# Patient Record
Sex: Female | Born: 1960 | Race: White | Hispanic: No | Marital: Married | Smoking: Current every day smoker
Health system: Southern US, Community
[De-identification: ages and names within clinical notes are randomized; demographics above are authoritative.]

## PROBLEM LIST (undated history)

## (undated) DIAGNOSIS — E7800 Pure hypercholesterolemia, unspecified: Secondary | ICD-10-CM

## (undated) HISTORY — PX: OOPHORECTOMY: SHX86

## (undated) HISTORY — DX: Pure hypercholesterolemia, unspecified: E78.00

---

## 1985-02-05 HISTORY — PX: TUBAL LIGATION: SHX77

## 1996-02-06 HISTORY — PX: BILATERAL OOPHORECTOMY: SHX1221

## 1996-02-06 HISTORY — PX: VAGINAL HYSTERECTOMY: SUR661

## 1997-12-03 ENCOUNTER — Other Ambulatory Visit: Admission: RE | Admit: 1997-12-03 | Discharge: 1997-12-03 | Payer: Self-pay | Admitting: Obstetrics and Gynecology

## 1999-01-24 ENCOUNTER — Other Ambulatory Visit: Admission: RE | Admit: 1999-01-24 | Discharge: 1999-01-24 | Payer: Self-pay | Admitting: Gynecology

## 1999-09-17 ENCOUNTER — Emergency Department (HOSPITAL_COMMUNITY): Admission: EM | Admit: 1999-09-17 | Discharge: 1999-09-17 | Payer: Self-pay | Admitting: *Deleted

## 2007-02-06 HISTORY — PX: AUGMENTATION MAMMAPLASTY: SUR837

## 2007-11-07 ENCOUNTER — Ambulatory Visit: Payer: Self-pay | Admitting: Gynecology

## 2008-01-07 ENCOUNTER — Ambulatory Visit: Payer: Self-pay | Admitting: Gynecology

## 2008-03-19 ENCOUNTER — Ambulatory Visit: Payer: Self-pay | Admitting: Gynecology

## 2009-04-11 ENCOUNTER — Ambulatory Visit: Payer: Self-pay | Admitting: Gynecology

## 2009-04-11 ENCOUNTER — Other Ambulatory Visit: Admission: RE | Admit: 2009-04-11 | Discharge: 2009-04-11 | Payer: Self-pay | Admitting: Gynecology

## 2009-12-07 ENCOUNTER — Ambulatory Visit: Payer: Self-pay | Admitting: Gynecology

## 2010-07-12 ENCOUNTER — Encounter: Payer: Self-pay | Admitting: Gynecology

## 2011-03-29 ENCOUNTER — Ambulatory Visit (INDEPENDENT_AMBULATORY_CARE_PROVIDER_SITE_OTHER): Payer: Self-pay | Admitting: Gynecology

## 2011-03-29 ENCOUNTER — Encounter: Payer: Self-pay | Admitting: Gynecology

## 2011-03-29 VITALS — BP 110/66 | Ht 62.5 in | Wt 105.0 lb

## 2011-03-29 DIAGNOSIS — R5381 Other malaise: Secondary | ICD-10-CM

## 2011-03-29 DIAGNOSIS — M810 Age-related osteoporosis without current pathological fracture: Secondary | ICD-10-CM

## 2011-03-29 DIAGNOSIS — R5383 Other fatigue: Secondary | ICD-10-CM

## 2011-03-29 DIAGNOSIS — N83209 Unspecified ovarian cyst, unspecified side: Secondary | ICD-10-CM | POA: Insufficient documentation

## 2011-03-29 DIAGNOSIS — Z79890 Hormone replacement therapy (postmenopausal): Secondary | ICD-10-CM

## 2011-03-29 DIAGNOSIS — Z01419 Encounter for gynecological examination (general) (routine) without abnormal findings: Secondary | ICD-10-CM

## 2011-03-29 DIAGNOSIS — D219 Benign neoplasm of connective and other soft tissue, unspecified: Secondary | ICD-10-CM | POA: Insufficient documentation

## 2011-03-29 DIAGNOSIS — N951 Menopausal and female climacteric states: Secondary | ICD-10-CM

## 2011-03-29 MED ORDER — ESTRADIOL 1 MG PO TABS: 1.0000 mg | ORAL_TABLET | Freq: Every day | ORAL | Status: DC

## 2011-03-29 NOTE — Progress Notes (Signed)
Jeannetta Cer[facility] [birth year] [id number]        51 y.o.  for annual exam.  Several issues noted below.  Past medical history,surgical history, medications, allergies, family history and social history were all reviewed and documented in the EPIC chart. ROS:  Was performed and pertinent positives and negatives are included in the history.  Exam: Kim chaperone present Filed Vitals:   03/29/11 1118  BP: 110/66   General appearance  Normal Skin grossly normal Head/Neck normal with no cervical or supraclavicular adenopathy thyroid normal Lungs  clear Cardiac RR, without RMG Abdominal  soft, nontender, without masses, organomegaly or hernia Breasts  examined lying and sitting without masses, retractions, discharge or axillary adenopathy.  Bilateral implants noted. Pelvic  Ext/BUS/vagina  normal   Adnexa  Without masses or tenderness    Anus and perineum  normal   Rectovaginal  normal sphincter tone without palpated masses or tenderness.    Assessment/Plan:  51 y.o. female for annual exam.   Status post TVH BSO for dysmenorrhea and irregular bleeding. 1. ERT. Patient had been on ERT but ran out. She is having fatigue with some hot flashes. She wants to reinitiate ERT. I discussed the WHI study with her increased risk of stroke heart attack DVT possible increased risk of breast cancer and question about lung cancer. The advantage of transdermal versus oral to decrease thrombosis risk. The patient does not want transdermal but would prefer oral and accepts the risks. Prescribed estradiol 1 mg, she had been on lower doses before and said it didn't help as much. Refill x1 year we'll see how she does with this. I did order a TSH given her complaints of fatigue just to make sure that this is also normal. 2. Stop smoking. I encouraged her to stop smoking as she is actively trying down using the nicotine patch and I applauded her for this. 3. Mammogram. His been several years since she's had a  mammogram. She has bilateral implants I strongly recommended her to schedule mammogram now. SBE monthly reviewed. 4. Pap smear. No Pap smear was done today. Last Pap smear was March 2011 and she has numerous normal report in her chart. I reviewed current screening guidelines. She's never had an abnormal Pap smear and her hysterectomy was for benign indications and she agrees with stop doing Pap smears now. 5. Osteoporosis. She has a history of osteoporosis diagnosed through her primary physician.Marland Kitchen Apparently she has been on several different medications and did not tolerate them and they were talking about Reclast she can't afford this. Recommended she follow up with her primary to see if maybe the drug company can facilitate a cheaper charge. She is overdue for her DEXA and again recommend she follow up with her primary as they are following this. 6. Colonoscopy. She turns 50 this year and I recommended arranging for baseline colonoscopy. 7. Health maintenance. No blood work other than the TSH was done today as it is done through her primary's office. She has no health insurance and finances are tight and she would prefer just to minimize expenses.    Dara Lords MD, 11:48 AM 03/29/2011

## 2011-03-29 NOTE — Patient Instructions (Signed)
Try hormone replacement as we discussed. Call me if you have any issues with this..  Consider Stop Smoking.  Help is available at [facility] smoking cessation program @ www.[place].com or 847-581-7821. OR 1-800-QUIT-NOW 801-293-5584) for free smoking cessation counseling.   Smoking Hazards Smoking cigarettes is extremely bad for your health. Tobacco smoke has over 200 known poisons in it. There are over 60 chemicals in tobacco smoke that cause cancer. Some of the chemicals found in cigarette smoke include:  Cyanide.  Benzene.  Formaldehyde.  Methanol (wood alcohol).  Acetylene (fuel used in welding torches).  Ammonia.  Cigarette smoke also contains the poisonous gases nitrogen oxide and carbon monoxide.  Cigarette smokers have an increased risk of many serious medical problems, including: Lung cancer.  Lung disease (such as pneumonia, bronchitis, and emphysema).  Heart attack and chest pain due to the heart not getting enough oxygen (angina).  Heart disease and peripheral blood vessel disease.  Hypertension.  Stroke.  Oral cancer (cancer of the lip, mouth, or voice box).  Bladder cancer.  Pancreatic cancer.  Cervical cancer.  Pregnancy complications, including premature birth.  Low birthweight babies.  Early menopause.  Lower estrogen level for women.  Infertility.  Facial wrinkles.  Blindness.  Increased risk of broken bones (fractures).  Senile dementia.  Stillbirths and smaller newborn babies, birth defects, and genetic damage to sperm.  Stomach ulcers and internal bleeding.  Children of smokers have an increased risk of the following, because of secondhand smoke exposure:  Sudden infant death syndrome (SIDS).  Respiratory infections.  Lung cancer.  Heart disease.  Ear infections.  Smoking causes approximately: 90% of all lung cancer deaths in men.  80% of all lung cancer deaths in women.  90% of deaths from chronic obstructive lung disease.  Compared  with nonsmokers, smoking increases the risk of: Coronary heart disease by 2 to 4 times.  Stroke by 2 to 4 times.  Men developing lung cancer by 23 times.  Women developing lung cancer by 13 times.  Dying from chronic obstructive lung diseases by 12 times.  Someone who smokes 2 packs a day loses about 8 years of his or her life. Even smoking lightly shortens your life expectancy by several years. You can greatly reduce the risk of medical problems for you and your family by stopping now. Smoking is the most preventable cause of death and disease in our society. Within days of quitting smoking, your circulation returns to normal, you decrease the risk of having a heart attack, and your lung capacity improves. There may be some increased phlegm in the first few days after quitting, and it may take months for your lungs to clear up completely. Quitting for 10 years cuts your lung cancer risk to almost that of a nonsmoker. WHY IS SMOKING ADDICTIVE? Nicotine is the chemical agent in tobacco that is capable of causing addiction or dependence.  When you smoke and inhale, nicotine is absorbed rapidly into the bloodstream through your lungs. Nicotine absorbed through the lungs is capable of creating a powerful addiction. Both inhaled and non-inhaled nicotine may be addictive.  Addiction studies of cigarettes and spit tobacco show that addiction to nicotine occurs mainly during the teen years, when young people begin using tobacco products.  WHAT ARE THE BENEFITS OF QUITTING?  There are many health benefits to quitting smoking.  Likelihood of developing cancer and heart disease decreases. Health improvements are seen almost immediately.  Blood pressure, pulse rate, and breathing patterns start returning to  normal soon after quitting.  People who quit may see an improvement in their overall quality of life.  Some people choose to quit all at once. Other options include nicotine replacement products, such as  patches, gum, and nasal sprays. Do not use these products without first checking with your caregiver. QUITTING SMOKING It is not easy to quit smoking. Nicotine is addicting, and longtime habits are hard to change. To start, you can write down all your reasons for quitting, tell your family and friends you want to quit, and ask for their help. Throw your cigarettes away, chew gum or cinnamon sticks, keep your hands busy, and drink extra water or juice. Go for walks and practice deep breathing to relax. Think of all the money you are saving: around $1,000 a year, for the average pack-a-day smoker. Nicotine patches and gum have been shown to improve success at efforts to stop smoking. Zyban (bupropion) is an anti-depressant drug that can be prescribed to reduce nicotine withdrawal symptoms and to suppress the urge to smoke. Smoking is an addiction with both physical and psychological effects. Joining a stop-smoking support group can help you cope with the emotional issues. For more information and advice on programs to stop smoking, call your doctor, your local hospital, or these organizations: American Lung Association - 1-800-LUNGUSA  American Cancer Society - 1-800-ACS-2345  Document Released: 03/01/2004 Document Revised: 10/04/2010 Document Reviewed: 11/03/2008 [facility] Patient Information 2012 [place], [place].  Smoking Cessation This document explains the best ways for you to quit smoking and new treatments to help. It lists new medicines that can double or triple your chances of quitting and quitting for good. It also considers ways to avoid relapses and concerns you may have about quitting, including weight gain. NICOTINE: A POWERFUL ADDICTION If you have tried to quit smoking, you know how hard it can be. It is hard because nicotine is a very addictive drug. For some people, it can be as addictive as heroin or cocaine. Usually, people make 2 or 3 tries, or more, before finally being able to quit.  Each time you try to quit, you can learn about what helps and what hurts. Quitting takes hard work and a lot of effort, but you can quit smoking. QUITTING SMOKING IS ONE OF THE MOST IMPORTANT THINGS YOU WILL EVER DO.  You will live longer, feel better, and live better.   The impact on your body of quitting smoking is felt almost immediately:   Within 20 minutes, blood pressure decreases. Pulse returns to its normal level.   After 8 hours, carbon monoxide levels in the blood return to normal. Oxygen level increases.   After 24 hours, chance of heart attack starts to decrease. Breath, hair, and body stop smelling like smoke.   After 48 hours, damaged nerve endings begin to recover. Sense of taste and smell improve.   After 72 hours, the body is virtually free of nicotine. Bronchial tubes relax and breathing becomes easier.   After 2 to 12 weeks, lungs can hold more air. Exercise becomes easier and circulation improves.   Quitting will reduce your risk of having a heart attack, stroke, cancer, or lung disease:   After 1 year, the risk of coronary heart disease is cut in half.   After 5 years, the risk of stroke falls to the same as a nonsmoker.   After 10 years, the risk of lung cancer is cut in half and the risk of other cancers decreases significantly.   After  15 years, the risk of coronary heart disease drops, usually to the level of a nonsmoker.   If you are pregnant, quitting smoking will improve your chances of having a healthy baby.   The people you live with, especially your children, will be healthier.   You will have extra money to spend on things other than cigarettes.  FIVE KEYS TO QUITTING Studies have shown that these 5 steps will help you quit smoking and quit for good. You have the best chances of quitting if you use them together: 1. Get ready.  2. Get support and encouragement.  3. Learn new skills and behaviors.  4. Get medicine to reduce your nicotine addiction  and use it correctly.  5. Be prepared for relapse or difficult situations. Be determined to continue trying to quit, even if you do not succeed at first.  1. GET READY  Set a quit date.   Change your environment.   Get rid of ALL cigarettes, ashtrays, matches, and lighters in your home, car, and place of work.   Do not let people smoke in your home.   Review your past attempts to quit. Think about what worked and what did not.   Once you quit, do not smoke. NOT EVEN A PUFF!  2. GET SUPPORT AND ENCOURAGEMENT Studies have shown that you have a better chance of being successful if you have help. You can get support in many ways.  Tell your family, friends, and coworkers that you are going to quit and need their support. Ask them not to smoke around you.   Talk to your caregivers (doctor, dentist, nurse, pharmacist, psychologist, and/or smoking counselor).   Get individual, group, or telephone counseling and support. The more counseling you have, the better your chances are of quitting. Programs are available at [employer] and health centers. Call your local health department for information about programs in your area.   Spiritual beliefs and practices may help some smokers quit.   Quit meters are Photographer that keep track of quit statistics, such as amount of "quit-time," cigarettes not smoked, and money saved.   Many smokers find one or more of the many self-help books available useful in helping them quit and stay off tobacco.  3. LEARN NEW SKILLS AND BEHAVIORS  Try to distract yourself from urges to smoke. Talk to someone, go for a walk, or occupy your time with a task.   When you first try to quit, change your routine. Take a different route to work. Drink tea instead of coffee. Eat breakfast in a different place.   Do something to reduce your stress. Take a hot bath, exercise, or read a book.   Plan something enjoyable to do every day.  Reward yourself for not smoking.   Explore interactive web-based programs that specialize in helping you quit.  4. GET MEDICINE AND USE IT CORRECTLY Medicines can help you stop smoking and decrease the urge to smoke. Combining medicine with the above behavioral methods and support can quadruple your chances of successfully quitting smoking. The U.S. Food and Drug Administration (FDA) has approved 7 medicines to help you quit smoking. These medicines fall into 3 categories.  Nicotine replacement therapy (delivers nicotine to your body without the negative effects and risks of smoking):   Nicotine gum: Available over-the-counter.   Nicotine lozenges: Available over-the-counter.   Nicotine inhaler: Available by prescription.   Nicotine nasal spray: Available by prescription.   Nicotine skin patches (  transdermal): Available by prescription and over-the-counter.   Antidepressant medicine (helps people abstain from smoking, but how this works is unknown):   Bupropion sustained-release (SR) tablets: Available by prescription.   Nicotinic receptor partial agonist (simulates the effect of nicotine in your brain):   Varenicline tartrate tablets: Available by prescription.   Ask your caregiver for advice about which medicines to use and how to use them. Carefully read the information on the package.   Everyone who is trying to quit may benefit from using a medicine. If you are pregnant or trying to become pregnant, nursing an infant, you are under age 69, or you smoke fewer than 10 cigarettes per day, talk to your caregiver before taking any nicotine replacement medicines.   You should stop using a nicotine replacement product and call your caregiver if you experience nausea, dizziness, weakness, vomiting, fast or irregular heartbeat, mouth problems with the lozenge or gum, or redness or swelling of the skin around the patch that does not go away.   Do not use any other product containing  nicotine while using a nicotine replacement product.   Talk to your caregiver before using these products if you have diabetes, heart disease, asthma, stomach ulcers, you had a recent heart attack, you have high blood pressure that is not controlled with medicine, a history of irregular heartbeat, or you have been prescribed medicine to help you quit smoking.  5. BE PREPARED FOR RELAPSE OR DIFFICULT SITUATIONS  Most relapses occur within the first 3 months after quitting. Do not be discouraged if you start smoking again. Remember, most people try several times before they finally quit.   You may have symptoms of withdrawal because your body is used to nicotine. You may crave cigarettes, be irritable, feel very hungry, cough often, get headaches, or have difficulty concentrating.   The withdrawal symptoms are only temporary. They are strongest when you first quit, but they will go away within 10 to 14 days.  Here are some difficult situations to watch for:  Alcohol. Avoid drinking alcohol. Drinking lowers your chances of successfully quitting.   Caffeine. Try to reduce the amount of caffeine you consume. It also lowers your chances of successfully quitting.   Other smokers. Being around smoking can make you want to smoke. Avoid smokers.   Weight gain. Many smokers will gain weight when they quit, usually less than 10 pounds. Eat a healthy diet and stay active. Do not let weight gain distract you from your main goal, quitting smoking. Some medicines that help you quit smoking may also help delay weight gain. You can always lose the weight gained after you quit.   Bad mood or depression. There are a lot of ways to improve your mood other than smoking.  If you are having problems with any of these situations, talk to your caregiver. SPECIAL SITUATIONS AND CONDITIONS Studies suggest that everyone can quit smoking. Your situation or condition can give you a special reason to quit.  Pregnant  women/new mothers: By quitting, you protect your baby's health and your own.   Hospitalized patients: By quitting, you reduce health problems and help healing.   Heart attack patients: By quitting, you reduce your risk of a second heart attack.   Lung, head, and neck cancer patients: By quitting, you reduce your chance of a second cancer.   Parents of children and adolescents: By quitting, you protect your children from illnesses caused by secondhand smoke.  QUESTIONS TO THINK ABOUT Think about the  following questions before you try to stop smoking. You may want to talk about your answers with your caregiver.  Why do you want to quit?   If you tried to quit in the past, what helped and what did not?   What will be the most difficult situations for you after you quit? How will you plan to handle them?   Who can help you through the tough times? Your family? Friends? Caregiver?   What pleasures do you get from smoking? What ways can you still get pleasure if you quit?  Here are some questions to ask your caregiver:  How can you help me to be successful at quitting?   What medicine do you think would be best for me and how should I take it?   What should I do if I need more help?   What is smoking withdrawal like? How can I get information on withdrawal?  Quitting takes hard work and a lot of effort, but you can quit smoking. FOR MORE INFORMATION  Smokefree.gov (http://www.davis-sullivan.com/) provides free, accurate, evidence-based information and professional assistance to help support the immediate and long-term needs of people trying to quit smoking. Document Released: 01/16/2001 Document Revised: 10/04/2010 Document Reviewed: 11/08/2008 [facility] Patient Information 2012 [place], [place].

## 2011-08-29 ENCOUNTER — Telehealth: Payer: Self-pay | Admitting: *Deleted

## 2011-08-29 MED ORDER — ESTRADIOL 1 MG PO TABS: 1.0000 mg | ORAL_TABLET | Freq: Every day | ORAL | Status: DC

## 2011-08-29 NOTE — Telephone Encounter (Signed)
[facility] NEVER GOT RX FOR ESTRADIOL 1 MG, RX SENT TO [facility] AS DIRECTED.

## 2012-02-06 DEATH — deceased

## 2013-03-10 ENCOUNTER — Encounter: Payer: Self-pay | Admitting: Gynecology

## 2013-03-10 ENCOUNTER — Other Ambulatory Visit (HOSPITAL_COMMUNITY)
Admission: RE | Admit: 2013-03-10 | Discharge: 2013-03-10 | Disposition: A | Discharge status: Home / Self Care | Payer: Self-pay | Source: Ambulatory Visit | Attending: Gynecology | Admitting: Gynecology

## 2013-03-10 ENCOUNTER — Ambulatory Visit (INDEPENDENT_AMBULATORY_CARE_PROVIDER_SITE_OTHER): Payer: Self-pay | Admitting: Gynecology

## 2013-03-10 VITALS — BP 110/66 | Ht 63.0 in | Wt 110.0 lb

## 2013-03-10 DIAGNOSIS — Z01419 Encounter for gynecological examination (general) (routine) without abnormal findings: Secondary | ICD-10-CM

## 2013-03-10 DIAGNOSIS — IMO0002 Reserved for concepts with insufficient information to code with codable children: Secondary | ICD-10-CM

## 2013-03-10 DIAGNOSIS — N952 Postmenopausal atrophic vaginitis: Secondary | ICD-10-CM

## 2013-03-10 MED ORDER — OSPEMIFENE 60 MG PO TABS: 60.0000 mg | ORAL_TABLET | Freq: Every day | ORAL | Status: DC

## 2013-03-10 NOTE — Progress Notes (Signed)
Franci Oshana [facility] 1960-08-15 [id number]        53 y.o.  G2P2 for annual exam.  Several issues noted below.  Past medical history,surgical history, problem list, medications, allergies, family history and social history were all reviewed and documented in the EPIC chart.  ROS:  Performed and pertinent positives and negatives are included in the history, assessment and plan .  Exam: Kim assistant Filed Vitals:   03/10/13 1004  BP: 110/66  Height: 5\' 3"  (1.6 m)  Weight: 110 lb (49.896 kg)   General appearance  Normal Skin grossly normal Head/Neck normal with no cervical or supraclavicular adenopathy thyroid normal Lungs  clear Cardiac RR, without RMG Abdominal  soft, nontender, without masses, organomegaly or hernia Breasts  examined lying and sitting without masses, retractions, discharge or axillary adenopathy. With bilateral implants. Pelvic  Ext/BUS/vagina  Normal with mild atrophic changes. Pap of cuff.  Adnexa  Without masses or tenderness    Anus and perineum  Normal   Rectovaginal  Normal sphincter tone without palpated masses or tenderness.    Assessment/Plan:  53 y.o. G2P2 female for annual exam.   1. Postmenopausal/atrophic genital changes/dyspareunia. Patient stopped her oral estrogen supplementation. Having some hot flashes but tolerable. She is having vaginal dryness with dyspareunia.  I reviewed options for management include reinitiation of ERT, topical estrogen products such as Vagifem and estrogen cream and daily Osphena. The pros/cons, risks/benefits of each option reviewed. Patient is interested in trying [place]. I reviewed the issues of SERM and breast, bone, coagulation stimulation potential. Relative short history with the drug and long-term effects not known. Side effects such as hot flashes also discussed. A two-week sample was given and an annual prescription was written. Followup if any issues with this. 2. Osteoporosis. She continues to be followed for  osteoporosis by her primary physician. I do not have access to her Dexa's lab studies. I did review with her that I wanted to make sure she was evaluated for secondary causes and I listed blood studies that I would have done to include TSH, PTH, 24-hour urine calcium excretion, vitamin D, comprehensive metabolic panel. She's to check to make sure that these were done through her primary physician's office. Apparently could not tolerate oral bisphosphonates. They were pushing her to consider Reclast. Apparently also discussed Forteo with her. I reviewed Prolia as well as Evista with her also. She is going to continue to followup with them for management. I did offer referral to a endocrinologist but was declined. 3. Pap smear discussed 11. Pap of cuff done today. Options to stop screening altogether versus less frequent screening intervals reviewed. No history of significant abnormal Pap smears and status post hysterectomy for benign indications. 4. Mammography overdue and I strongly recommended that she schedule this and patient agrees to do so. SBE monthly reviewed. 5. Colonoscopy never. Recommendations for screening colonoscopy at age 60 reviewed and recommended. 6. Stop smoking again discussed. 7. Health maintenance. No routine lab work done as patient reports this all done through her primary physician's office. Followup one year, sooner as needed.   Note: This document was prepared with digital dictation and possible smart phrase technology. Any transcriptional errors that result from this process are unintentional.   Anastasio Auerbach MD, 10:44 AM 03/10/2013

## 2013-03-10 NOTE — Addendum Note (Signed)
Addended by: Nelva Nay on: 03/10/2013 10:54 AM   Modules accepted: Orders

## 2013-03-10 NOTE — Patient Instructions (Addendum)
Evaluation for secondary causes for osteoporosis include: Thyroid hormone evaluation, parathyroid hormone evaluation, calcium level, 24-hour urinary calcium excretion collection and vitamin D level  Treatments for osteoporosis include the oral bisphosphonates such as Fosamax, Actonel or Boniva. IV yearly Reclast. Twice yearly subcutaneous injection of Prolia. Evista which acts like estrogen to help protect the bones and decreases her breast cancer risk. Forteo which is a daily injection.  Schedule colonoscopy with [facility] gastroenterology at [phone] or [facility] gastroenterology at [phone]   Call to Schedule your mammogram  Facilities in [place]: 1)  [facility], [address] Phone: [phone])  [facility]. [address] [place] Phone: [phone])  Dr. Isaiah Blakes at [facility] [address] Phone: [phone]     Mammogram A mammogram is an X-ray test to find changes in a woman's breast. You should get a mammogram if:  You are 56 years of age or older  You have risk factors.   Your doctor recommends that you have one.  BEFORE THE TEST  Do not schedule the test the week before your period, especially if your breasts are sore during this time.  On the day of your mammogram:  Wash your breasts and armpits well. After washing, do not put on any deodorant or talcum powder on until after your test.   Eat and drink as you usually do.   Take your medicines as usual.   If you are diabetic and take insulin, make sure you:   Eat before coming for your test.   Take your insulin as usual.   If you cannot keep your appointment, call before the appointment to cancel. Schedule another appointment.  TEST  You will need to undress from the waist up. You will put on a hospital gown.   Your breast will be put on the mammogram machine, and it will press firmly on your  breast with a piece of plastic called a compression paddle. This will make your breast flatter so that the machine can X-ray all parts of your breast.   Both breasts will be X-rayed. Each breast will be X-rayed from above and from the side. An X-ray might need to be taken again if the picture is not good enough.   The mammogram will last about 15 to 30 minutes.  AFTER THE TEST Finding out the results of your test Ask when your test results will be ready. Make sure you get your test results.  Document Released: 04/20/2008 Document Revised: 01/11/2011 Document Reviewed: 04/20/2008 [facility] Patient Information 2012 [place].

## 2013-12-07 ENCOUNTER — Encounter: Payer: Self-pay | Admitting: Gynecology

## 2015-03-28 ENCOUNTER — Encounter (HOSPITAL_BASED_OUTPATIENT_CLINIC_OR_DEPARTMENT_OTHER): Payer: Self-pay | Admitting: Emergency Medicine

## 2015-03-28 ENCOUNTER — Emergency Department (HOSPITAL_BASED_OUTPATIENT_CLINIC_OR_DEPARTMENT_OTHER)
Admission: EM | Admit: 2015-03-28 | Discharge: 2015-03-29 | Disposition: A | Discharge status: Home / Self Care | Payer: Self-pay | Attending: Emergency Medicine | Admitting: Emergency Medicine

## 2015-03-28 DIAGNOSIS — Z8739 Personal history of other diseases of the musculoskeletal system and connective tissue: Secondary | ICD-10-CM | POA: Insufficient documentation

## 2015-03-28 DIAGNOSIS — K2970 Gastritis, unspecified, without bleeding: Secondary | ICD-10-CM | POA: Insufficient documentation

## 2015-03-28 DIAGNOSIS — F17210 Nicotine dependence, cigarettes, uncomplicated: Secondary | ICD-10-CM | POA: Insufficient documentation

## 2015-03-28 DIAGNOSIS — Z79899 Other long term (current) drug therapy: Secondary | ICD-10-CM | POA: Insufficient documentation

## 2015-03-28 LAB — CBC WITH DIFFERENTIAL/PLATELET
BASOS PCT: 0 %
Basophils Absolute: 0 10*3/uL (ref 0.0–0.1)
EOS ABS: 0.1 10*3/uL (ref 0.0–0.7)
Eosinophils Relative: 1 %
HCT: 37.9 % (ref 36.0–46.0)
Hemoglobin: 12.3 g/dL (ref 12.0–15.0)
Lymphocytes Relative: 32 %
Lymphs Abs: 2.8 10*3/uL (ref 0.7–4.0)
MCH: 30.1 pg (ref 26.0–34.0)
MCHC: 32.5 g/dL (ref 30.0–36.0)
MCV: 92.7 fL (ref 78.0–100.0)
MONO ABS: 0.6 10*3/uL (ref 0.1–1.0)
MONOS PCT: 7 %
NEUTROS PCT: 60 %
Neutro Abs: 5 10*3/uL (ref 1.7–7.7)
Platelets: 268 10*3/uL (ref 150–400)
RBC: 4.09 MIL/uL (ref 3.87–5.11)
RDW: 12.6 % (ref 11.5–15.5)
WBC: 8.5 10*3/uL (ref 4.0–10.5)

## 2015-03-28 LAB — COMPREHENSIVE METABOLIC PANEL
ALBUMIN: 4 g/dL (ref 3.5–5.0)
ALT: 18 U/L (ref 14–54)
ANION GAP: 9 (ref 5–15)
AST: 19 U/L (ref 15–41)
Alkaline Phosphatase: 70 U/L (ref 38–126)
BUN: 9 mg/dL (ref 6–20)
CHLORIDE: 101 mmol/L (ref 101–111)
CO2: 30 mmol/L (ref 22–32)
Calcium: 9.2 mg/dL (ref 8.9–10.3)
Creatinine, Ser: 0.61 mg/dL (ref 0.44–1.00)
GFR calc Af Amer: >60 mL/min (ref >60)
GFR calc non Af Amer: >60 mL/min (ref >60)
GLUCOSE: 111 mg/dL — AB (ref 65–99)
POTASSIUM: 3.9 mmol/L (ref 3.5–5.1)
SODIUM: 140 mmol/L (ref 135–145)
Total Bilirubin: 0.5 mg/dL (ref 0.3–1.2)
Total Protein: 6.7 g/dL (ref 6.5–8.1)

## 2015-03-28 LAB — URINALYSIS, ROUTINE W REFLEX MICROSCOPIC
Bilirubin Urine: NEGATIVE
GLUCOSE, UA: NEGATIVE mg/dL
Ketones, ur: NEGATIVE mg/dL
LEUKOCYTES UA: NEGATIVE
Nitrite: NEGATIVE
PROTEIN: NEGATIVE mg/dL
SPECIFIC GRAVITY, URINE: 1.008 (ref 1.005–1.030)
pH: 7 (ref 5.0–8.0)

## 2015-03-28 LAB — URINE MICROSCOPIC-ADD ON: WBC, UA: NONE SEEN WBC/hpf (ref 0–5)

## 2015-03-28 LAB — LIPASE, BLOOD: Lipase: 34 U/L (ref 11–51)

## 2015-03-28 MED ORDER — SODIUM CHLORIDE 0.9 % IV SOLN
INTRAVENOUS | Status: DC
  Administered 2015-03-28: via INTRAVENOUS

## 2015-03-28 MED ORDER — SUCRALFATE 1 G PO TABS
1.0000 g | ORAL_TABLET | Freq: Once | ORAL | Status: AC
  Administered 2015-03-29: 1 g via ORAL
  Filled 2015-03-28: qty 1

## 2015-03-28 NOTE — ED Notes (Signed)
Generalized abdominal pain x 1 week. The patient reports that felt like it was acid reflux to start with. Patient reports that she is nauseated

## 2015-03-28 NOTE — ED Provider Notes (Addendum)
CSN: [id number]     Arrival date & time 03/28/15  2032 History  By signing my name below, I, Eustaquio Maize, attest that this documentation has been prepared under the direction and in the presence of Shanon Rosser, MD. Electronically Signed: Eustaquio Maize, ED Scribe. 03/28/2015. 11:56 PM.   Chief Complaint  Patient presents with  . Abdominal Pain   The history is provided by the patient. No language interpreter was used.    HPI Comments: Penny Dean is a 55 y.o. female who presents to the Emergency Department complaining of gradual onset, constant, burning, epigastric abdominal pain x 1 week. Pt mentions that the pain felt like acid reflux but she does not have a hx of GERD. She also complains of nausea, sensation of abdominal bloating and mild intermittent diarrhea. She reports that she is able to burp and pass gas without issue. Pt has taken Pepto-Bismal, Alka Seltzer and Nexium without relief. Denies vomiting or any other associated symptoms.    Past Medical History  Diagnosis Date  . Osteoporosis    Past Surgical History  Procedure Laterality Date  . Augmentation mammaplasty  2009  . Tubal ligation  1987  . Oophorectomy      BSO  . Bilateral oophorectomy  1998  . Vaginal hysterectomy  1998    Pain/ bleeding with pathology showing adenomyosis   Family History  Problem Relation Age of Onset  . Diabetes Maternal Aunt   . Diabetes Maternal Grandmother   . Cancer Maternal Grandmother     LUNG  . Cancer Maternal Grandfather     LUNG  . Cancer Paternal Grandmother     LUNG  . Cancer Paternal Grandfather     LUNG  . Cancer Father     LUNG   Social History  Substance Use Topics  . Smoking status: Current Every Day Smoker -- 0.30 packs/day    Types: Cigarettes  . Smokeless tobacco: Never Used  . Alcohol Use: Yes     Comment: OCCAS   OB History    Gravida Para Term Preterm AB TAB SAB Ectopic Multiple Living   2 2        2      Review of Systems  A complete  10 system review of systems was obtained and all systems are negative except as noted in the HPI and PMH.   Allergies  Betadine; Iodine; and Red dye  Home Medications   Prior to Admission medications   Medication Sig Start Date End Date Taking? Authorizing Provider  Calcium Carbonate-Vitamin D (CALCIUM + D PO) Take by mouth.    Historical Provider, MD  Ospemifene (OSPHENA) 60 MG TABS Take 60 mg by mouth daily. 03/10/13   Anastasio Auerbach, MD   BP 126/82 mmHg  Pulse 67  Temp(Src) 97.8 F (36.6 C)  Resp 18  Ht 5\' 3"  (1.6 m)  Wt 110 lb (49.896 kg)  BMI 19.49 kg/m2  SpO2 99%   Physical Exam  Nursing note and vitals reviewed. General: Well-developed, well-nourished female in no acute distress; appearance consistent with age of record HENT: normocephalic; atraumatic Eyes: pupils equal, round and reactive to light; extraocular muscles intact Neck: supple Heart: regular rate and rhythm; no murmurs, rubs or gallops Lungs: clear to auscultation bilaterally Abdomen: soft; nondistended; mild epigastric tenderness; no masses or hepatosplenomegaly; bowel sounds present Extremities: No deformity; full range of motion; pulses normal Neurologic: Awake, alert and oriented; motor function intact in all extremities and symmetric; no facial droop Skin: Warm  and dry Psychiatric: Normal mood and affect  ED Course  Procedures (including critical care time)  DIAGNOSTIC STUDIES: Oxygen Saturation is 99% on RA, normal by my interpretation.    COORDINATION OF CARE: 11:55 PM-Discussed treatment plan which includes Carafate with pt at bedside and pt agreed to plan.    MDM   Nursing notes and vitals signs, including pulse oximetry, reviewed.  Summary of this visit's results, reviewed by myself:  Labs:  Results for orders placed or performed during the hospital encounter of 03/28/15 (from the past 24 hour(s))  Urinalysis, Routine w reflex microscopic (not at [facility])     Status: Abnormal    Collection Time: 03/28/15  9:15 PM  Result Value Ref Range   Color, Urine YELLOW YELLOW   APPearance CLOUDY (A) CLEAR   Specific Gravity, Urine 1.008 1.005 - 1.030   pH 7.0 5.0 - 8.0   Glucose, UA NEGATIVE NEGATIVE mg/dL   Hgb urine dipstick SMALL (A) NEGATIVE   Bilirubin Urine NEGATIVE NEGATIVE   Ketones, ur NEGATIVE NEGATIVE mg/dL   Protein, ur NEGATIVE NEGATIVE mg/dL   Nitrite NEGATIVE NEGATIVE   Leukocytes, UA NEGATIVE NEGATIVE  Urine microscopic-add on     Status: Abnormal   Collection Time: 03/28/15  9:15 PM  Result Value Ref Range   Squamous Epithelial / LPF 0-5 (A) NONE SEEN   WBC, UA NONE SEEN 0 - 5 WBC/hpf   RBC / HPF 0-5 0 - 5 RBC/hpf   Bacteria, UA RARE (A) NONE SEEN   Urine-Other AMORPHOUS URATES/PHOSPHATES   CBC with Differential/Platelet     Status: None   Collection Time: 03/28/15 11:25 PM  Result Value Ref Range   WBC 8.5 4.0 - 10.5 K/uL   RBC 4.09 3.87 - 5.11 MIL/uL   Hemoglobin 12.3 12.0 - 15.0 g/dL   HCT 37.9 36.0 - 46.0 %   MCV 92.7 78.0 - 100.0 fL   MCH 30.1 26.0 - 34.0 pg   MCHC 32.5 30.0 - 36.0 g/dL   RDW 12.6 11.5 - 15.5 %   Platelets 268 150 - 400 K/uL   Neutrophils Relative % 60 %   Neutro Abs 5.0 1.7 - 7.7 K/uL   Lymphocytes Relative 32 %   Lymphs Abs 2.8 0.7 - 4.0 K/uL   Monocytes Relative 7 %   Monocytes Absolute 0.6 0.1 - 1.0 K/uL   Eosinophils Relative 1 %   Eosinophils Absolute 0.1 0.0 - 0.7 K/uL   Basophils Relative 0 %   Basophils Absolute 0.0 0.0 - 0.1 K/uL  Comprehensive metabolic panel     Status: Abnormal   Collection Time: 03/28/15 11:25 PM  Result Value Ref Range   Sodium 140 135 - 145 mmol/L   Potassium 3.9 3.5 - 5.1 mmol/L   Chloride 101 101 - 111 mmol/L   CO2 30 22 - 32 mmol/L   Glucose, Bld 111 (H) 65 - 99 mg/dL   BUN 9 6 - 20 mg/dL   Creatinine, Ser 0.61 0.44 - 1.00 mg/dL   Calcium 9.2 8.9 - 10.3 mg/dL   Total Protein 6.7 6.5 - 8.1 g/dL   Albumin 4.0 3.5 - 5.0 g/dL   AST 19 15 - 41 U/L   ALT 18 14 - 54 U/L    Alkaline Phosphatase 70 38 - 126 U/L   Total Bilirubin 0.5 0.3 - 1.2 mg/dL   GFR calc non Af Amer >60 >60 mL/min   GFR calc Af Amer >60 >60 mL/min   Anion gap 9  5 - 15  Lipase, blood     Status: None   Collection Time: 03/28/15 11:25 PM  Result Value Ref Range   Lipase 34 11 - 51 U/L   12:29 AM Feels better after Carafate orally. We'll treat for gastritis.  I personally performed the services described in this documentation, which was scribed in my presence. The recorded information has been reviewed and is accurate.    Shanon Rosser, MD 03/29/15 [id number]  Shanon Rosser, MD 03/29/15 [id number]

## 2015-03-29 MED ORDER — PANTOPRAZOLE SODIUM 40 MG IV SOLR
40.0000 mg | Freq: Once | INTRAVENOUS | Status: AC
  Administered 2015-03-29: 40 mg via INTRAVENOUS
  Filled 2015-03-29: qty 40

## 2015-03-29 MED ORDER — ESOMEPRAZOLE MAGNESIUM 40 MG PO CPDR: DELAYED_RELEASE_CAPSULE | ORAL | Status: DC

## 2015-03-29 MED ORDER — SUCRALFATE 1 G PO TABS: 1.0000 g | ORAL_TABLET | Freq: Three times a day (TID) | ORAL | Status: DC

## 2015-03-29 NOTE — Discharge Instructions (Signed)

## 2015-06-22 ENCOUNTER — Telehealth: Payer: Self-pay | Admitting: *Deleted

## 2015-06-22 ENCOUNTER — Encounter: Payer: Self-pay | Admitting: Gynecology

## 2015-06-22 ENCOUNTER — Ambulatory Visit (INDEPENDENT_AMBULATORY_CARE_PROVIDER_SITE_OTHER): Payer: Self-pay | Admitting: Gynecology

## 2015-06-22 VITALS — BP 114/70

## 2015-06-22 DIAGNOSIS — N644 Mastodynia: Secondary | ICD-10-CM

## 2015-06-22 NOTE — Patient Instructions (Signed)
Office will contact you to arrange for the mammogram and ultrasound. Call my office if you do not hear from them within several days.

## 2015-06-22 NOTE — Telephone Encounter (Signed)
Left message for pt to call.

## 2015-06-22 NOTE — Telephone Encounter (Signed)
Orders placed they will contact pt to schedule. 

## 2015-06-22 NOTE — Progress Notes (Signed)
    Penny Dean [facility] January 21, 1961 [id number]        55 y.o.  G2P2 presents complaining of left breast tenderness over the last several months. Does not have tenderness in the right breast.  Unsure if she's not feeling some nodularity.  No nipple discharge.  The area she is pointing to is in the tail of Spence through 3:00 position.  Past medical history,surgical history, problem list, medications, allergies, family history and social history were all reviewed and documented in the EPIC chart.  Directed ROS with pertinent positives and negatives documented in the history of present illness/assessment and plan.  Exam: Penny Dean assistant Filed Vitals:   06/22/15 1416  BP: 114/70   General appearance:  Normal Both breast examined lying and sitting without masses retractions discharge adenopathy. Bilateral implants noted.  Assessment/Plan:  55 y.o. G2P2 with persistent left breast tenderness primarily in the tail of Spence. No palpable abnormalities. Patient is unable to clearly show me where she is feeling nodularity but points to that entire area is being tender. We'll initiate diagnostic mammography and ultrasound over this area. If both studies negative patient will continue with self breast exams as long as she feels no clear masses and the tenderness clears and will follow. If she feels any palpable abnormalities or if the tenderness worsens or persists she'll follow up for further evaluation.    Anastasio Auerbach MD, 2:42 PM 06/22/2015

## 2015-06-22 NOTE — Telephone Encounter (Signed)
Pt called c/o breast tenderness requesting order faxed. I advised pt that OV needed for this for physician to exam. Transferred to appointment desk

## 2015-06-22 NOTE — Telephone Encounter (Signed)
-----   Message from Anastasio Auerbach, MD sent at 06/22/2015  2:45 PM EDT ----- Schedule diagnostic mammography and left breast ultrasound reference persistent left tail of Spence discomfort. Normal physician exam.

## 2015-06-23 NOTE — Telephone Encounter (Signed)
appt 06/29/15 @ 1:50pm

## 2015-06-29 ENCOUNTER — Other Ambulatory Visit: Payer: Self-pay | Admitting: Gynecology

## 2015-06-29 ENCOUNTER — Ambulatory Visit
Admission: RE | Admit: 2015-06-29 | Discharge: 2015-06-29 | Disposition: A | Discharge status: Home / Self Care | Payer: No Typology Code available for payment source | Source: Ambulatory Visit | Attending: Gynecology | Admitting: Gynecology

## 2015-06-29 DIAGNOSIS — N644 Mastodynia: Secondary | ICD-10-CM

## 2015-08-11 ENCOUNTER — Encounter: Payer: Self-pay | Admitting: Gynecology

## 2015-11-28 ENCOUNTER — Ambulatory Visit (INDEPENDENT_AMBULATORY_CARE_PROVIDER_SITE_OTHER): Payer: Self-pay | Admitting: Gynecology

## 2015-11-28 ENCOUNTER — Encounter: Payer: Self-pay | Admitting: Gynecology

## 2015-11-28 VITALS — BP 118/76 | Ht 63.0 in | Wt 109.0 lb

## 2015-11-28 DIAGNOSIS — N951 Menopausal and female climacteric states: Secondary | ICD-10-CM

## 2015-11-28 DIAGNOSIS — Z01419 Encounter for gynecological examination (general) (routine) without abnormal findings: Secondary | ICD-10-CM

## 2015-11-28 DIAGNOSIS — N952 Postmenopausal atrophic vaginitis: Secondary | ICD-10-CM

## 2015-11-28 DIAGNOSIS — M810 Age-related osteoporosis without current pathological fracture: Secondary | ICD-10-CM

## 2015-11-28 LAB — TSH: TSH: 0.65 mIU/L

## 2015-11-28 MED ORDER — ESTRADIOL 0.5 MG PO TABS: 0.5000 mg | ORAL_TABLET | Freq: Every day | ORAL | 11 refills | Status: DC

## 2015-11-28 NOTE — Patient Instructions (Signed)
Start back on the low-dose estrogen as we discussed. Call me if you have any issues with this.  Follow up for the bone density as scheduled. It is important that you address this as you are to young not to treat osteoporosis.  Schedule a screening with a gastroenterologist. colonoscopy

## 2015-11-28 NOTE — Progress Notes (Signed)
Penny Dean [facility] 1960/05/26 [id number]        55 y.o.  G2P2  for annual exam.  Several issues noted below.  Past medical history,surgical history, problem list, medications, allergies, family history and social history were all reviewed and documented as reviewed in the EPIC chart.  ROS:  Performed with pertinent positives and negatives included in the history, assessment and plan.   Additional significant findings :  None   Exam: [name] assistant Vitals:   11/28/15 1142  BP: 118/76  Weight: 109 lb (49.4 kg)  Height: 5\' 3"  (1.6 m)   Body mass index is 19.31 kg/m.  General appearance:  Normal affect, orientation and appearance. Skin: Grossly normal HEENT: Without gross lesions.  No cervical or supraclavicular adenopathy. Thyroid normal.  Lungs:  Clear without wheezing, rales or rhonchi Cardiac: RR, without RMG Abdominal:  Soft, nontender, without masses, guarding, rebound, organomegaly or hernia Breasts:  Examined lying and sitting without masses, retractions, discharge or axillary adenopathy.Bilateral implants noted Pelvic:  Ext, BUS, Vagina with atrophic changes  Adnexa without masses or tenderness    Anus and perineum normal   Rectovaginal normal sphincter tone without palpated masses or tenderness.    Assessment/Plan:  55 y.o. G2P2 female for annual exam.   1. Menopausal symptoms.  Having some hot flushes and sweats. Also notes fatigue and difficulty sleeping. Had transient trial of ERT in the past but has been off of this for several years. Wondering whether she wants to restart on ERT. I reviewed with her the most current 2017 NAMS guidelines on HRT. The benefits of ERT to include symptom relief, possible cardiovascular and bone health when started earlier and also the risks to include thrombosis such as stroke heart attack DVT and breast cancer. She does continue to smoke which is a risk factor for thrombosis. I reviewed all this with her at this point she  wants a trial of ERT. Options for treatment to include transdermal with possible decreased risk of thrombosis versus oral discussed. She is self-pay and prefers the cheaper oral route understanding and accepting the issues. We'll go ahead and start with estradiol 0.5 mg #30 with one year refill and see how she does with this. She'll call if she has any issues with this. Stop smoking was also discussed. 2. Osteoporosis. She has a history of osteoporosis historically. Was followed by her primary physician. I have no copies of these Dexa's. Apparently had trials of Fosamax and Actonel which she did not tolerate. I previously discussed with her the need to make sure she was evaluated for secondary causes and gave her a list of studies to be done to provide to her primary physician to make sure they are done. She notes now that her primary physician has closed his practice. Recommended that she get baseline DEXA now and I ordered this. Patient appears to be somewhat reluctant and I stressed the need to have this done and to address her osteoporosis at her young age and the significant risk of fracture as she ages. I also discussed treatment options to include possible Prolia or Reclast. She is starting on ERT which would be beneficial a bone standpoint. Regardless, I strongly recommended she schedule her DEXA and placed order for her to schedule and she acknowledges my recommendations. 3. Mammography. Left mammogram and ultrasound in May due to discomfort where fibrocystic changes were found in a recommended 6 month follow up. Patient relates just receiving the letter she is going to call  and schedule make sure she also has a screening mammography done also as she is due. SBE monthly reviewed. 4. Pap smear 03/2013. Pap smear of vaginal cuff done today. Options to stop screening per current screening guidelines based on hysterectomy history discussed. No history of abnormal Pap smears previously. 5. Colonoscopy never.  I again recommended screening colonoscopy in the benefits of early detection or precancerous polyp removal discussed. Patient acknowledges my recommendation. 6. Health maintenance. No routine lab work done at her request. Follow up for DEXA and TSH results. Follow up with ERT results and any issues. Follow up in one year for annual exam.   Anastasio Auerbach MD, 12:18 PM 11/28/2015

## 2015-11-28 NOTE — Addendum Note (Signed)
Addended by: Nelva Nay on: 11/28/2015 12:29 PM   Modules accepted: Orders

## 2015-11-29 LAB — PAP IG W/ RFLX HPV ASCU

## 2016-01-09 ENCOUNTER — Other Ambulatory Visit: Payer: Self-pay | Admitting: Gynecology

## 2016-01-09 DIAGNOSIS — N63 Unspecified lump in unspecified breast: Secondary | ICD-10-CM

## 2016-01-23 ENCOUNTER — Ambulatory Visit
Admission: RE | Admit: 2016-01-23 | Discharge: 2016-01-23 | Disposition: A | Discharge status: Home / Self Care | Payer: Self-pay | Source: Ambulatory Visit | Attending: Gynecology | Admitting: Gynecology

## 2016-01-23 DIAGNOSIS — N63 Unspecified lump in unspecified breast: Secondary | ICD-10-CM

## 2017-01-09 ENCOUNTER — Other Ambulatory Visit: Payer: Self-pay | Admitting: Gynecology

## 2017-01-16 ENCOUNTER — Telehealth: Payer: Self-pay | Admitting: *Deleted

## 2017-01-16 MED ORDER — ESTRADIOL 0.5 MG PO TABS: 0.5000 mg | ORAL_TABLET | Freq: Every day | ORAL | 1 refills | Status: DC

## 2017-01-16 NOTE — Telephone Encounter (Signed)
Patient annual scheduled on 02/26/17, needs refill on estradiol 0.5 mg. Rx sent.

## 2017-02-25 ENCOUNTER — Other Ambulatory Visit: Payer: Self-pay | Admitting: Gynecology

## 2017-02-26 ENCOUNTER — Encounter: Payer: Self-pay | Admitting: Gynecology

## 2017-02-26 ENCOUNTER — Ambulatory Visit (INDEPENDENT_AMBULATORY_CARE_PROVIDER_SITE_OTHER): Payer: No Typology Code available for payment source | Admitting: Gynecology

## 2017-02-26 VITALS — BP 122/78 | Ht 63.0 in | Wt 114.0 lb

## 2017-02-26 DIAGNOSIS — Z79890 Hormone replacement therapy (postmenopausal): Secondary | ICD-10-CM

## 2017-02-26 DIAGNOSIS — Z01411 Encounter for gynecological examination (general) (routine) with abnormal findings: Secondary | ICD-10-CM | POA: Diagnosis not present

## 2017-02-26 DIAGNOSIS — N952 Postmenopausal atrophic vaginitis: Secondary | ICD-10-CM | POA: Diagnosis not present

## 2017-02-26 DIAGNOSIS — M810 Age-related osteoporosis without current pathological fracture: Secondary | ICD-10-CM | POA: Diagnosis not present

## 2017-02-26 MED ORDER — ESTRADIOL 0.5 MG PO TABS: 0.5000 mg | ORAL_TABLET | Freq: Every day | ORAL | 12 refills | Status: DC

## 2017-02-26 NOTE — Patient Instructions (Addendum)
Follow-up for the bone density as scheduled.  Schedule your mammogram  Schedule your colonoscopy

## 2017-02-26 NOTE — Progress Notes (Signed)
    Alejandro Adcox [facility] 1960-02-27 [id number]        57 y.o.  G2P2 for annual gynecologic exam.    Past medical history,surgical history, problem list, medications, allergies, family history and social history were all reviewed and documented as reviewed in the EPIC chart.  ROS:  Performed with pertinent positives and negatives included in the history, assessment and plan.   Additional significant findings : None   Exam: [name] assistant Vitals:   02/26/17 1404  BP: 122/78  Weight: 114 lb (51.7 kg)  Height: 5\' 3"  (1.6 m)   Body mass index is 20.19 kg/m.  General appearance:  Normal affect, orientation and appearance. Skin: Grossly normal HEENT: Without gross lesions.  No cervical or supraclavicular adenopathy. Thyroid normal.  Lungs:  Clear without wheezing, rales or rhonchi Cardiac: RR, without RMG Abdominal:  Soft, nontender, without masses, guarding, rebound, organomegaly or hernia Breasts:  Examined lying and sitting without masses, retractions, discharge or axillary adenopathy.  Bilateral implants noted Pelvic:  Ext, BUS, Vagina: With atrophic changes  Adnexa: Without masses or tenderness    Anus and perineum: Normal   Rectovaginal: Normal sphincter tone without palpated masses or tenderness.    Assessment/Plan:  57 y.o. G2P2 female for annual gynecologic exam status post TVH BSO in the past for bleeding/pain with adenomyosis.   1. Menopausal/HRT.  On estradiol 0.5 mg daily doing well.  Has tried to stop with unacceptable symptoms.  I again reviewed the risks to include thrombosis such as stroke heart attack DVT.  The breast cancer issue also discussed.  Benefits particularly with her history of osteoporosis reviewed to include cardiovascular, bone health and symptom relief.  At this point patient wants to continue and I refilled her times 1 year. 2. Osteoporosis.  I had never gotten a copy of any bone densities.  She apparently transiently tried oral  bisphosphonates with side effects.  Is uncomfortable with the risks of injectable such as Prolia or Reclast.  I reviewed a risk versus benefit issue to include risks of fracture with significant sequela I versus the unusual complications associated with the medications.  After lengthy discussion the patient agrees to schedule a bone density but is unclear whether she will proceed with medication or not.  I also offered referral to Dr Cruzita Lederer.  We will start with the bone density and go from there.   Check vitamin D level today also. 3. Mammography overdue.  Patient agrees to call and schedule.  Breast exam normal today. 4. Pap smear 2017.  No Pap smear done today.  No history of significant abnormal Pap smears.  Options to stop screening per current screening guidelines and hysterectomy history reviewed.  Will readdress on an annual basis. 5. Colonoscopy never.  We discussed this in the past with recommendations to schedule and she acknowledges my recommendation. 6. Health maintenance.  No routine lab work done as patient's arranging follow-up with her primary and will do this through their office.  Follow-up for bone density.  Follow-up for annual exam in 1 year.   Anastasio Auerbach MD, 2:33 PM 02/26/2017

## 2017-02-27 LAB — VITAMIN D 25 HYDROXY (VIT D DEFICIENCY, FRACTURES): Vit D, 25-Hydroxy: 24 ng/mL — ABNORMAL LOW (ref 30–100)

## 2017-02-28 ENCOUNTER — Other Ambulatory Visit: Payer: Self-pay | Admitting: Gynecology

## 2017-02-28 DIAGNOSIS — E559 Vitamin D deficiency, unspecified: Secondary | ICD-10-CM

## 2017-02-28 MED ORDER — VITAMIN D (ERGOCALCIFEROL) 1.25 MG (50000 UNIT) PO CAPS: 50000.0000 [IU] | ORAL_CAPSULE | ORAL | 0 refills | Status: DC

## 2017-09-20 IMAGING — MG MM DIAG BREAST W/IMPLANT TOMO BILATERAL
8 of 20 series · 8 of 40 positions shown · non-contrast
Comparison: New baseline;

CLINICAL DATA: Focal tenderness in the lateral portion of the left
breast.

EXAM:
2D DIGITAL DIAGNOSTIC BILATERAL MAMMOGRAM WITH CAD AND ADJUNCT TOMO
ULTRASOUND LEFT BREAST

[R MLO (1 of 3)]
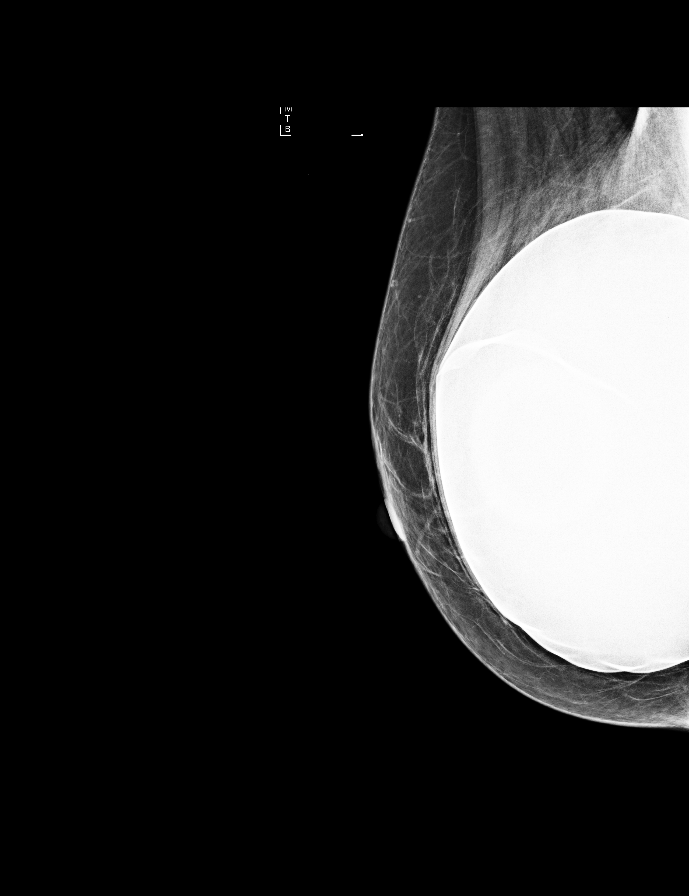

[L MLO (1 of 3)]
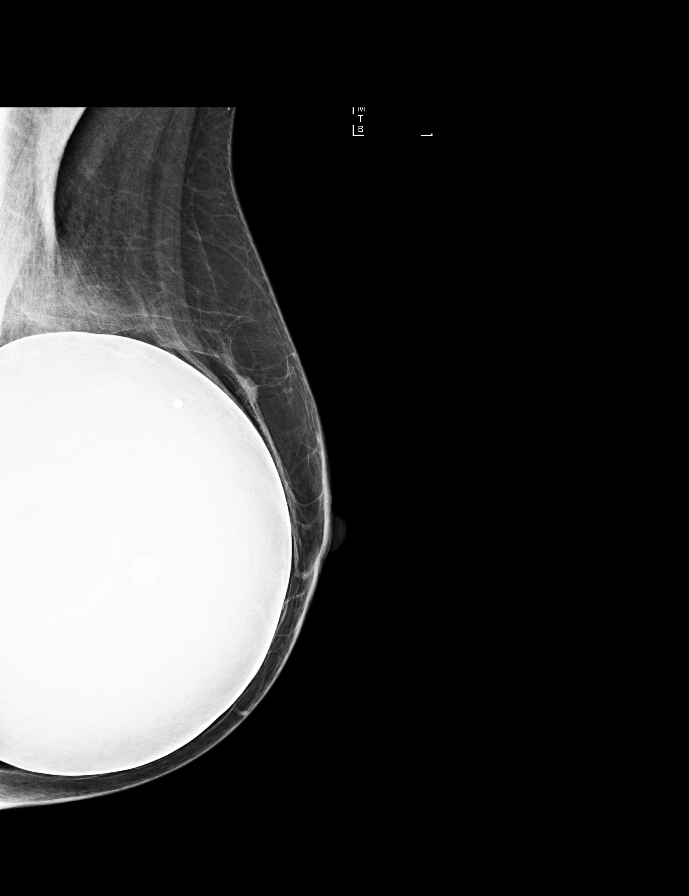

[L MLO (2 of 3)]
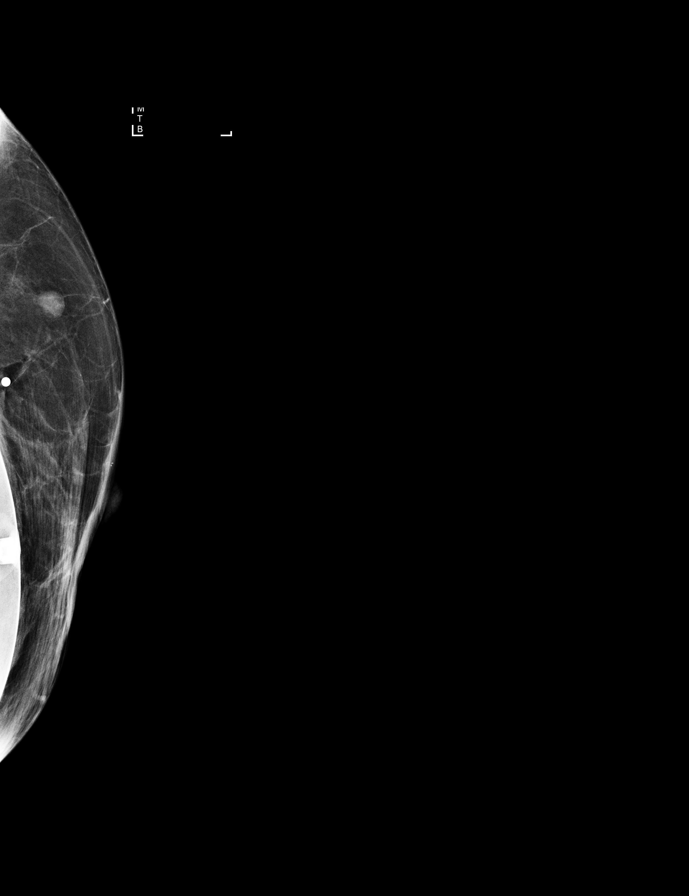

[R MLO (2 of 3)]
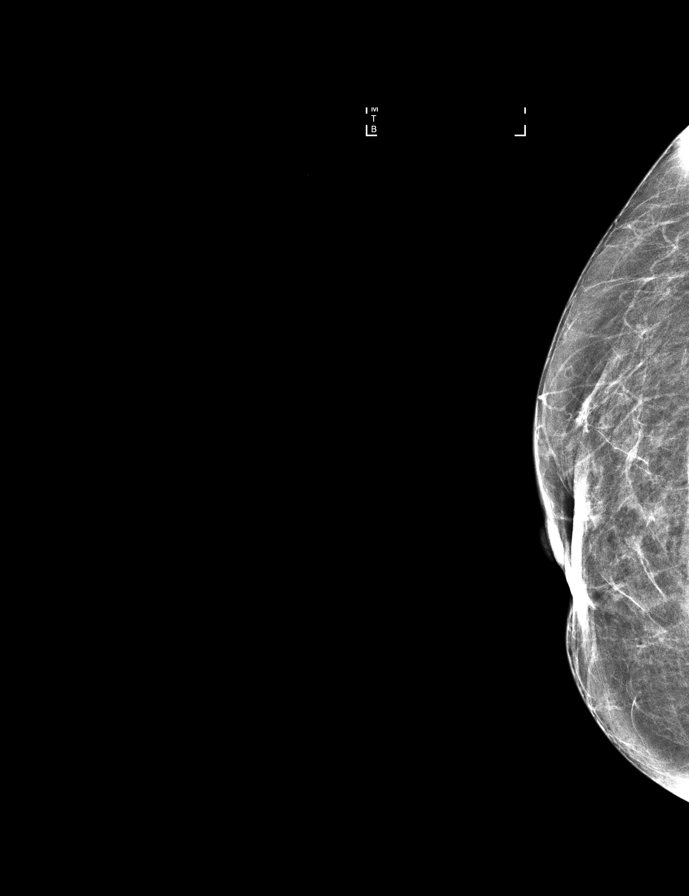

[R CC]
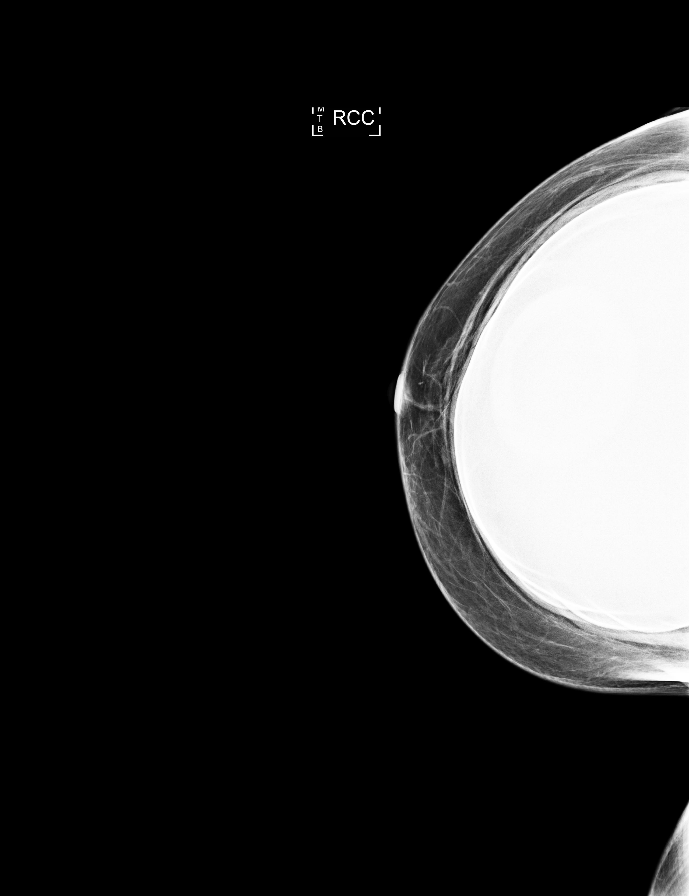

[R MLO (3 of 3)]
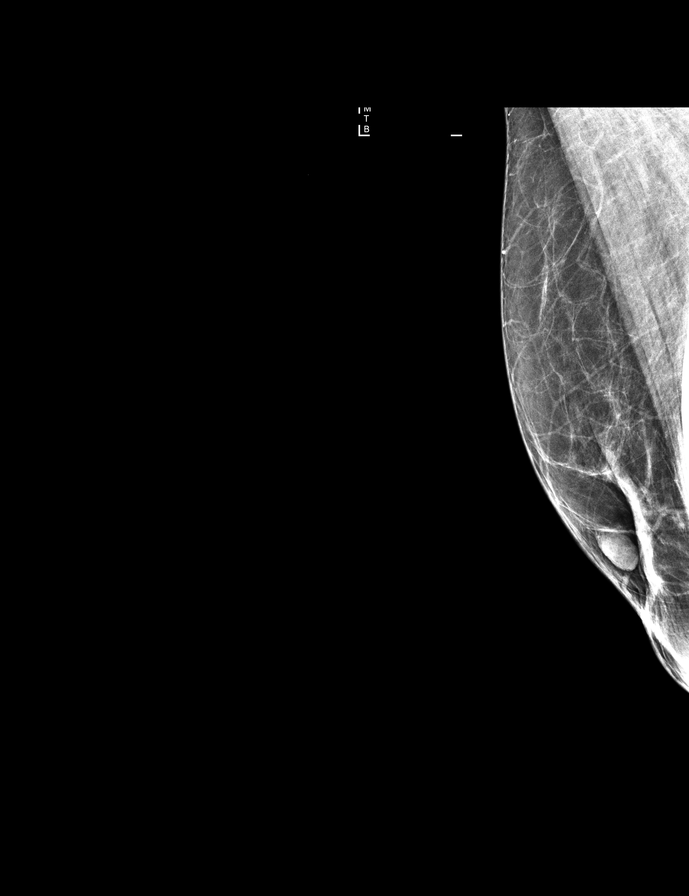

[R MLO synth-2D]
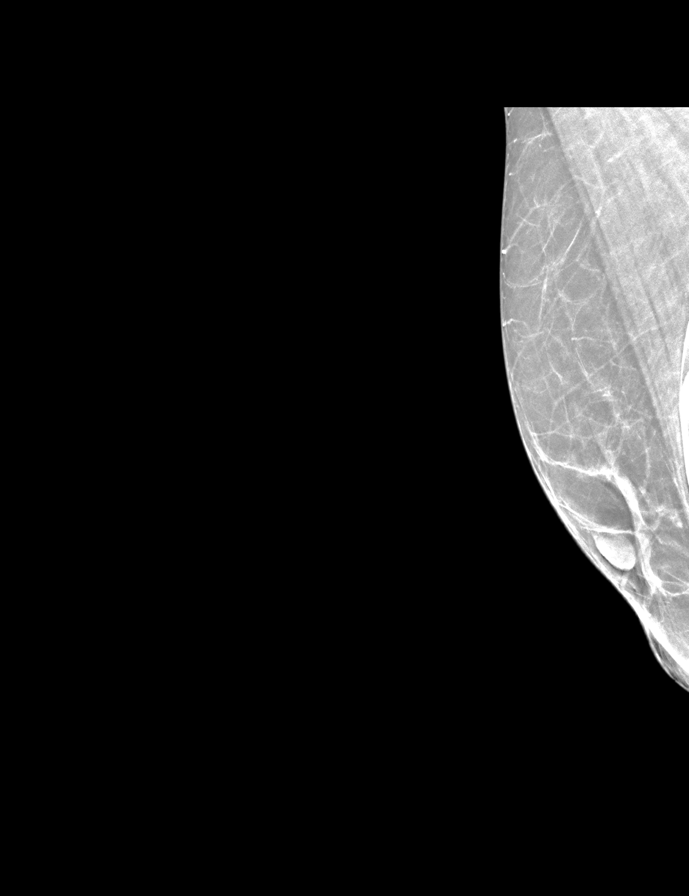

[L MLO (3 of 3)]
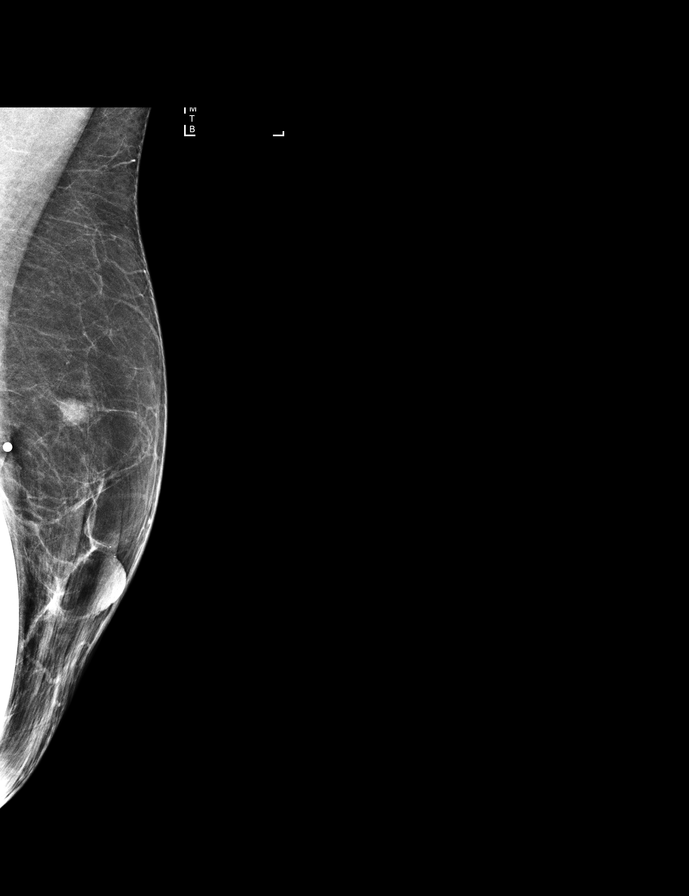

[8 of 40 positions shown; findings below may reference images not displayed]

prior images have been purged.

ACR Breast Density Category b: There are scattered areas of
fibroglandular density.
FINDINGS: Standard and implant displaced views are performed, demonstrating
subpectoral saline implants. Within the upper portion of the left
breast, there is a partially obscured mass on tomosynthesis images,
further evaluated with ultrasound. No suspicious microcalcifications
or distortion identified. Right breast is negative.

Mammographic images were processed with CAD.

On physical exam, I palpate no abnormality in the lateral aspect of
the left breast in the area of patient's concern.

Targeted ultrasound is performed, showing a small hypoechoic nodule
with internal septations in the 12:30 o'clock location of the left
breast 2 cm from the nipple measuring 0.3 x 0.8 x 0.5 cm. A similar
focal nodule in the 2 o'clock location of the left breast 5 cm from
nipple it is 0.6 x 0.6 x 0.2 cm. Findings are consistent with focal
areas of fibrocystic change/ apocrine metaplasia. No solid masses or
acoustic shadowing identified.
IMPRESSION: 1.  No mammographic or ultrasound evidence for malignancy.
2. Areas of probably benign fibrocystic changes/apocrine metaplasia
in the left breast warrant follow-up to document stability.

RECOMMENDATION:
Left breast ultrasound is suggested in 6 months.

I have discussed the findings and recommendations with the patient.
Results were also provided in writing at the conclusion of the
visit. If applicable, a reminder letter will be sent to the patient
regarding the next appointment.

BI-RADS CATEGORY  3: Probably benign.

## 2018-02-24 ENCOUNTER — Other Ambulatory Visit: Payer: Self-pay | Admitting: Internal Medicine

## 2018-04-03 ENCOUNTER — Other Ambulatory Visit: Payer: Self-pay | Admitting: Gynecology

## 2018-04-09 ENCOUNTER — Telehealth: Payer: Self-pay | Admitting: *Deleted

## 2018-04-09 ENCOUNTER — Other Ambulatory Visit: Payer: Self-pay | Admitting: Gynecology

## 2018-04-09 MED ORDER — ESTRADIOL 0.5 MG PO TABS: 0.5000 mg | ORAL_TABLET | Freq: Every day | ORAL | 1 refills | Status: DC

## 2018-04-09 NOTE — Telephone Encounter (Signed)
Patient has annual exam schedule on 05/26/18,needs refill on estradiol 0.5 mg tablet.  Rx sent.

## 2018-04-16 IMAGING — US ULTRASOUND LEFT BREAST LIMITED
1 series · 11 of 11 positions shown · non-contrast
Comparison: 06/29/2015

CLINICAL DATA: Six-month follow-up for left breast nodules.

EXAM:
ULTRASOUND OF THE LEFT BREAST

[Series 1: ultrasound left breast limited · 0.05mm/px · 11 of 11 slices shown]
[im 1/11]
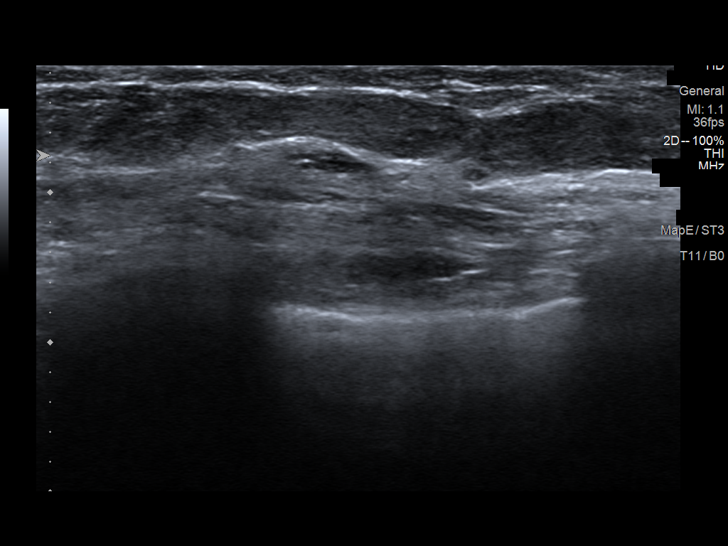
[im 2/11]
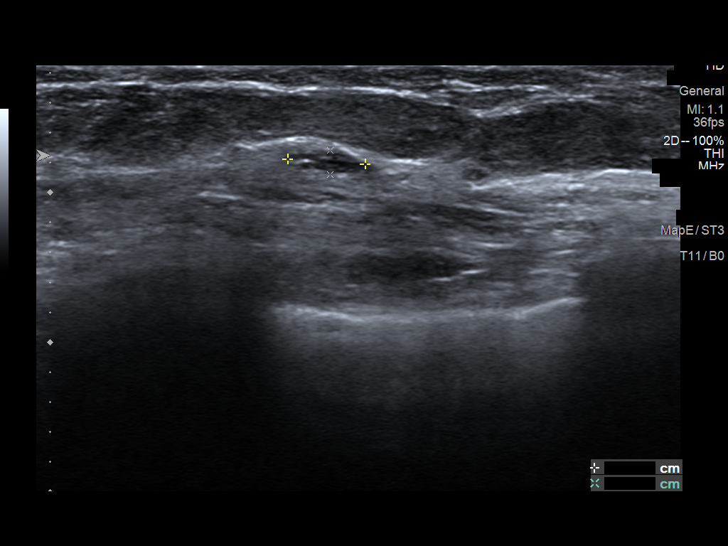
[im 3/11]
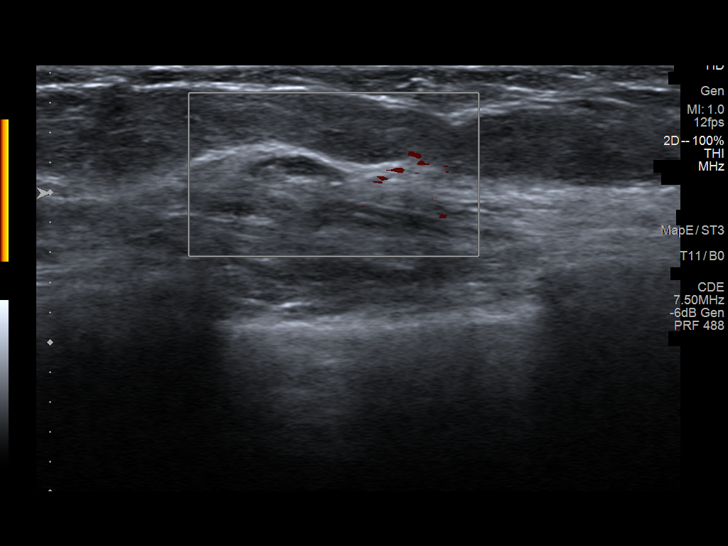
[im 4/11]
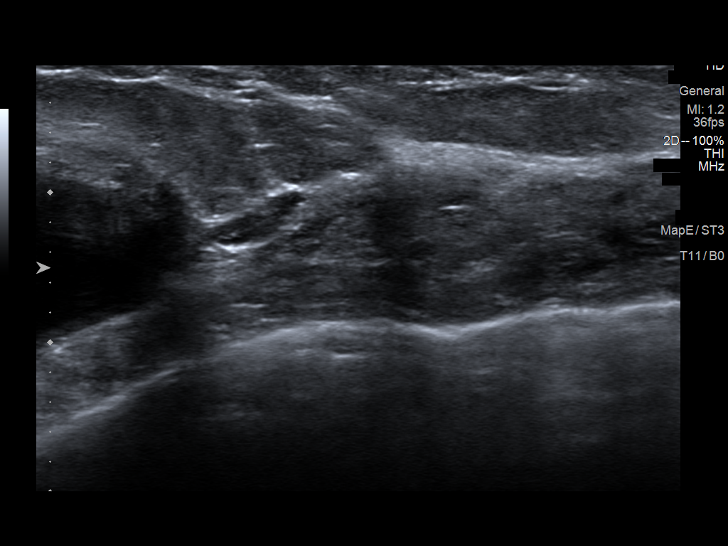
[im 5/11]
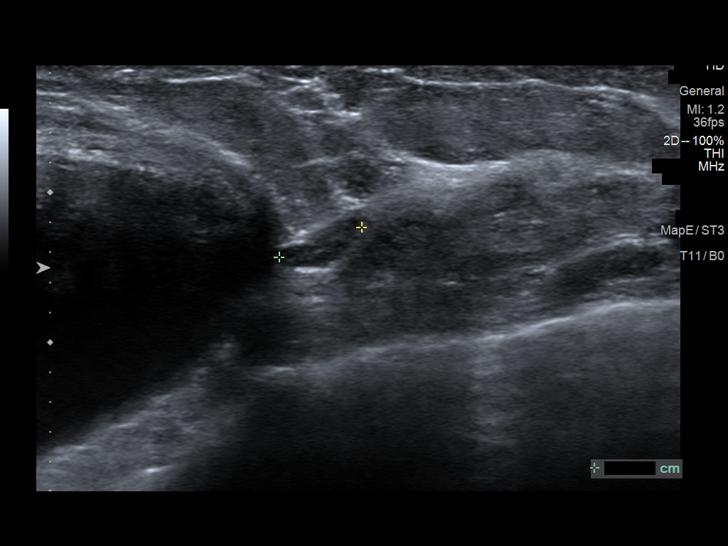
[im 6/11]
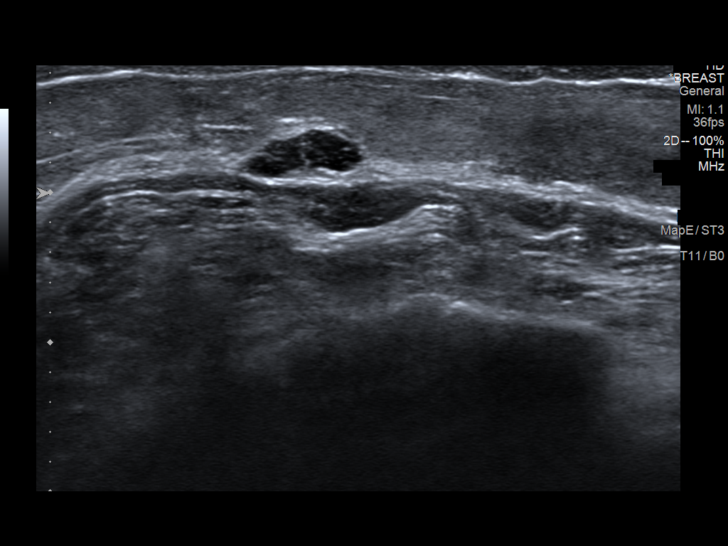
[im 7/11]
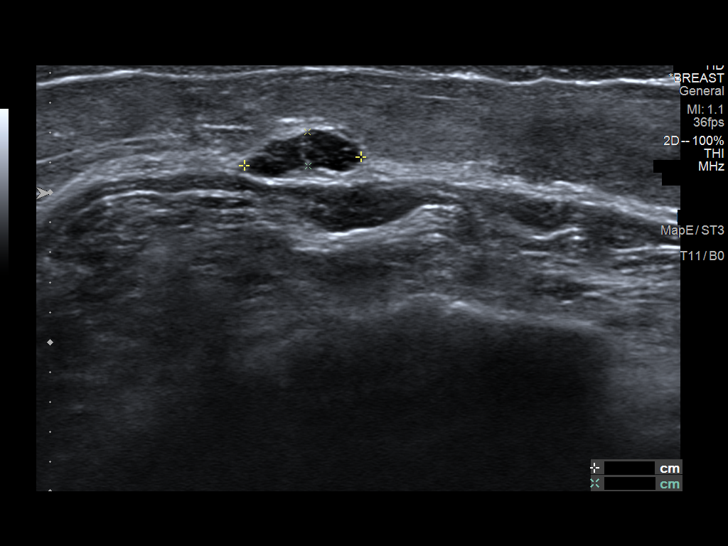
[im 8/11]
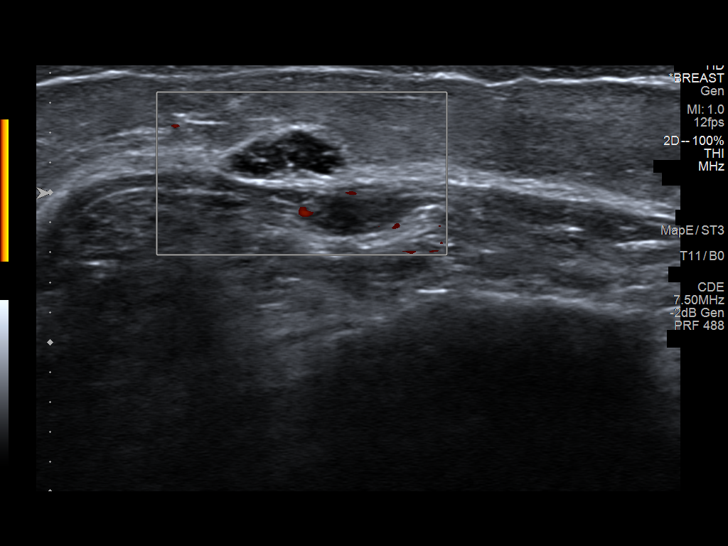
[im 9/11]
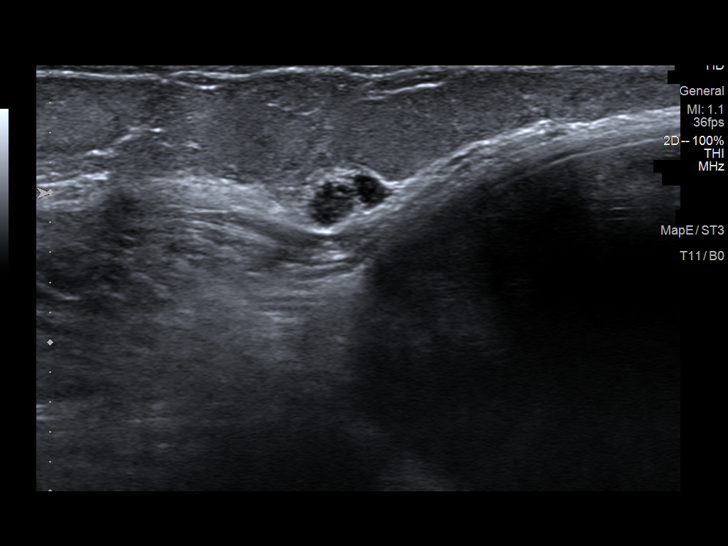
[im 10/11]
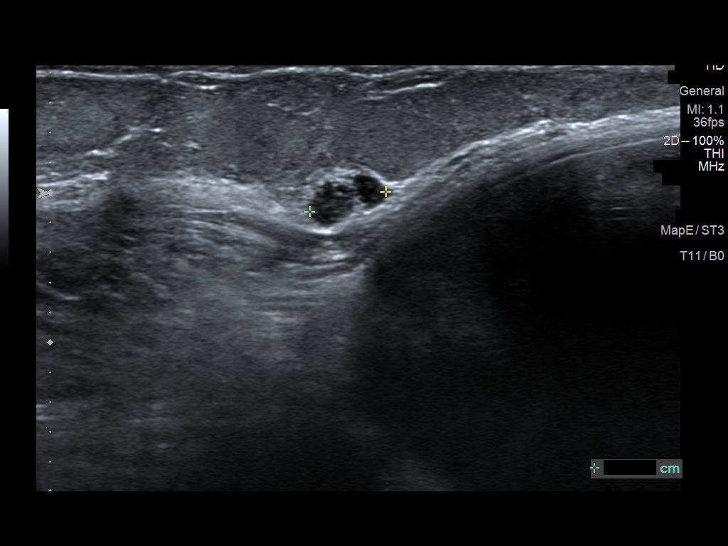
[im 11/11]
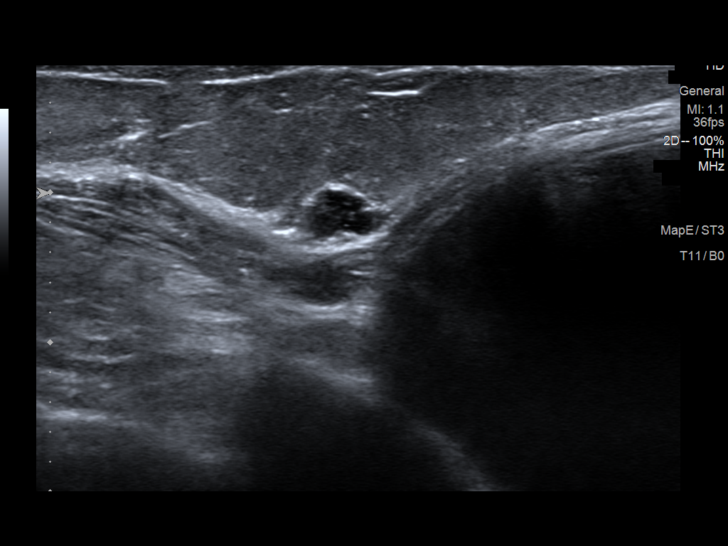

[11 of 11 positions shown; findings below may reference images not displayed]

FINDINGS: On physical exam, I palpate no abnormality in the upper-outer
quadrant of the left breast.

Targeted ultrasound is performed, showing a hypoechoic nodule in the
2 o'clock location of the left breast 5 cm from nipple measuring
x 0.2 x 0.6 cm. A hypoechoic nodule in the 12:30 o'clock location of
the left breast 3 cm from nipple measures 2.8 x 0.2 x 0.5 cm.
Nodules have similar ultrasonographic appearance, with internal
echoes and increased through transmission. The appearance is similar
to the previous exam, favoring benign apocrine metaplasia or
fibrocystic changes.
IMPRESSION: Stable, probably benign nodules in the upper-outer quadrant of the
left breast.

RECOMMENDATION:
Follow-up bilateral diagnostic mammogram and left breast ultrasound
suggested in 6 months.

I have discussed the findings and recommendations with the patient.
Results were also provided in writing at the conclusion of the
visit. If applicable, a reminder letter will be sent to the patient
regarding the next appointment.

BI-RADS CATEGORY  3: Probably benign.

## 2018-05-23 ENCOUNTER — Other Ambulatory Visit: Payer: Self-pay

## 2018-05-26 ENCOUNTER — Ambulatory Visit (INDEPENDENT_AMBULATORY_CARE_PROVIDER_SITE_OTHER): Payer: BLUE CROSS/BLUE SHIELD | Admitting: Gynecology

## 2018-05-26 ENCOUNTER — Encounter: Payer: Self-pay | Admitting: Gynecology

## 2018-05-26 ENCOUNTER — Other Ambulatory Visit: Payer: Self-pay

## 2018-05-26 VITALS — BP 118/76 | Ht 62.0 in | Wt 117.0 lb

## 2018-05-26 DIAGNOSIS — N952 Postmenopausal atrophic vaginitis: Secondary | ICD-10-CM | POA: Diagnosis not present

## 2018-05-26 DIAGNOSIS — Z01419 Encounter for gynecological examination (general) (routine) without abnormal findings: Secondary | ICD-10-CM

## 2018-05-26 DIAGNOSIS — Z79890 Hormone replacement therapy (postmenopausal): Secondary | ICD-10-CM

## 2018-05-26 DIAGNOSIS — E559 Vitamin D deficiency, unspecified: Secondary | ICD-10-CM

## 2018-05-26 DIAGNOSIS — M810 Age-related osteoporosis without current pathological fracture: Secondary | ICD-10-CM | POA: Diagnosis not present

## 2018-05-26 MED ORDER — ESTRADIOL 0.5 MG PO TABS: ORAL_TABLET | ORAL | 4 refills | Status: DC

## 2018-05-26 NOTE — Progress Notes (Signed)
    Penny Dean [birth year] [id number]        58 y.o.  G2P2 for annual gynecologic exam.  Gynecologic complaints.  Past medical history,surgical history, problem list, medications, allergies, family history and social history were all reviewed and documented as reviewed in the EPIC chart.  ROS:  Performed with pertinent positives and negatives included in the history, assessment and plan.   Additional significant findings : None   Exam: [name] assistant Vitals:   05/26/18 1153  BP: 118/76  Weight: 117 lb (53.1 kg)  Height: 5\' 2"  (1.575 m)   Body mass index is 21.4 kg/m.  General appearance:  Normal affect, orientation and appearance. Skin: Grossly normal HEENT: Without gross lesions.  No cervical or supraclavicular adenopathy. Thyroid normal.  Lungs:  Clear without wheezing, rales or rhonchi Cardiac: RR, without RMG Abdominal:  Soft, nontender, without masses, guarding, rebound, organomegaly or hernia Breasts:  Examined lying and sitting without masses, retractions, discharge or axillary adenopathy.  Bilateral implants noted Pelvic:  Ext, BUS, Vagina: With atrophic changes  Adnexa: Without masses or tenderness    Anus and perineum: Normal   Rectovaginal: Normal sphincter tone without palpated masses or tenderness.    Assessment/Plan:  [name] female for annual gynecologic exam.   1. Postmenopausal.  Continues on estradiol 0.5 mg daily.  We again reviewed the risks versus benefits to include increased risk of thrombosis such as stroke heart attack DVT in the breast cancer issue.  Benefits to include bone health with her history as discussed below noted.  At this point the patient wants to continue and I refilled her x1 year. 2. Osteoporosis.  Patient reports having been told she had osteoporosis and that her bones were "bad" years ago.  Had taken medication but had issues with her jawbone.  Is afraid of using parenteral such as Reclast or Prolia.  I again discussed  the significance of a hip fracture and spinal fractures.  I again strongly recommended she schedule a baseline bone density and orders were placed.  Patient's not sure that she would take any medication regardless and I stressed the importance of information and least we will have a baseline for discussion.  At this point the patient does not appear ready to schedule but I placed the order for her to call back and do so if she changes her mind.  She will continue on her estrogen.  She did have a low vitamin D of 24 last year.  Had taken prescription strength vitamin D but stopped because it was rough on her stomach.  Check vitamin D level today. 3. Mammography 01/2018.  Continue with annual mammography end of this year.  Breast exam normal today. 4. Colonoscopy never.  Second most common cancer in women discussed and I strongly recommended she schedule a screening colonoscopy.  Patient states that she is planning to do that this year. 5. Pap smear 2017.  Pap smear of vaginal cuff done today.  No history of significant abnormal Pap smears.  Options to stop screening per current screening guidelines based on hysterectomy history discussed.  Will readdress on an annual basis. 6. Health maintenance.  No routine lab work done as patient reports is done elsewhere.  Follow-up 1 year, sooner as needed.   Anastasio Auerbach MD, 12:13 PM 05/26/2018

## 2018-05-26 NOTE — Patient Instructions (Signed)
Schedule your colonoscopy  Schedule a bone density  Follow-up in 1 year for annual exam

## 2018-05-26 NOTE — Addendum Note (Signed)
Addended by: Nelva Nay on: 05/26/2018 12:55 PM   Modules accepted: Orders

## 2018-05-27 ENCOUNTER — Encounter: Payer: Self-pay | Admitting: Gynecology

## 2018-05-27 LAB — PAP IG W/ RFLX HPV ASCU

## 2018-05-27 LAB — VITAMIN D 25 HYDROXY (VIT D DEFICIENCY, FRACTURES): Vit D, 25-Hydroxy: 31 ng/mL (ref 30–100)

## 2018-06-10 ENCOUNTER — Telehealth: Payer: Self-pay

## 2018-06-10 NOTE — Telephone Encounter (Signed)
My Chart message from Dr. Loetta Rough was returned unread. I called patient and spoke with her and relayed "Your vitamin D level was 31. Less than 30 is low so you are at the lower end of normal. I would recommend adding 1000 units over-the-counter vitamin D daily. Hopefully this will not upset your stomach. The dose you were taking before was 50,000 units see you can see this is much less. Hopefully this will raise your vitamin D level some."

## 2018-11-04 ENCOUNTER — Encounter: Payer: Self-pay | Admitting: Gynecology

## 2019-06-11 ENCOUNTER — Other Ambulatory Visit: Payer: Self-pay | Admitting: *Deleted

## 2019-08-17 ENCOUNTER — Other Ambulatory Visit: Payer: Self-pay

## 2019-08-31 ENCOUNTER — Telehealth: Payer: Self-pay | Admitting: *Deleted

## 2019-08-31 MED ORDER — ESTRADIOL 0.5 MG PO TABS: ORAL_TABLET | ORAL | 0 refills | Status: DC

## 2019-08-31 NOTE — Telephone Encounter (Signed)
Patient has annual exam scheduled on 09/23/19 needs refill on estradiol 0.5 mg tablet.

## 2019-09-23 ENCOUNTER — Other Ambulatory Visit: Payer: Self-pay

## 2019-09-23 ENCOUNTER — Ambulatory Visit (INDEPENDENT_AMBULATORY_CARE_PROVIDER_SITE_OTHER): Payer: BLUE CROSS/BLUE SHIELD | Admitting: Obstetrics and Gynecology

## 2019-09-23 ENCOUNTER — Encounter: Payer: Self-pay | Admitting: Obstetrics and Gynecology

## 2019-09-23 VITALS — BP 118/70 | Ht 62.0 in | Wt 118.0 lb

## 2019-09-23 DIAGNOSIS — Z720 Tobacco use: Secondary | ICD-10-CM | POA: Diagnosis not present

## 2019-09-23 DIAGNOSIS — Z01419 Encounter for gynecological examination (general) (routine) without abnormal findings: Secondary | ICD-10-CM | POA: Diagnosis not present

## 2019-09-23 DIAGNOSIS — M810 Age-related osteoporosis without current pathological fracture: Secondary | ICD-10-CM

## 2019-09-23 DIAGNOSIS — Z79890 Hormone replacement therapy (postmenopausal): Secondary | ICD-10-CM | POA: Diagnosis not present

## 2019-09-23 MED ORDER — ESTRADIOL 0.5 MG PO TABS
ORAL_TABLET | ORAL | 4 refills | Status: DC
Start: 2019-09-23 — End: 2021-01-12

## 2019-09-23 NOTE — Progress Notes (Signed)
Penny Dean 1960-02-14 [id number]  SUBJECTIVE:  59 y.o. G2P2 female here for an annual routine gynecologic exam. She has no gynecologic concerns.  Current Outpatient Medications  Medication Sig Dispense Refill  . estradiol (ESTRACE) 0.5 MG tablet TAKE 1 TABLET BY MOUTH EACH DAY 90 tablet 0  . atorvastatin (LIPITOR) 20 MG tablet Take 20 mg by mouth daily. (Patient not taking: Reported on 09/23/2019)     No current facility-administered medications for this visit.   Allergies: Betadine [povidone iodine], Iodine, and Red dye  No LMP recorded. Patient has had a hysterectomy.  Past medical history,surgical history, problem list, medications, allergies, family history and social history were all reviewed and documented as reviewed in the EPIC chart.  ROS:  Feeling well. No dyspnea or chest pain on exertion.  No abdominal pain, change in bowel habits, black or bloody stools.  No urinary tract symptoms. GYN ROS: no abnormal bleeding, pelvic pain or discharge, no breast pain or new or enlarging lumps on self exam.  No neurological complaints.   OBJECTIVE:  BP 118/70   Ht 5\' 2"  (1.575 m)   Wt 118 lb (53.5 kg)   BMI 21.58 kg/m  The patient appears well, alert, oriented x 3, in no distress. ENT normal.  Neck supple. No cervical or supraclavicular adenopathy or thyromegaly.  Lungs are clear, good air entry, no wheezes, rhonchi or rales. S1 and S2 normal, no murmurs, regular rate and rhythm.  Abdomen soft without tenderness, guarding, mass or organomegaly.  Neurological is normal, no focal findings.  BREAST EXAM: breasts appear normal, bilateral implants noted, no suspicious masses, no skin or nipple changes or axillary nodes  PELVIC EXAM: VULVA: normal appearing vulva with no masses, tenderness or lesions, atrophic changes, VAGINA: normal appearing vagina with normal color and discharge, atrophic changes, no lesions, CERVIX: surgically absent, UTERUS: surgically absent, vaginal cuff  normal, ADNEXA: no masses, nontender  Chaperone: [name] present during the examination  ASSESSMENT:  [name] here for annual gynecologic exam  PLAN:   1. Postmenopausal/HRT. Prior TVH BSO for heavy menstrual bleeding and pain due to adenomyosis.  She continues on estradiol 0.5 mg daily and does well.  She has tried to wean off in the past and has terrible hot flash symptoms.  She would like to continue on the current dose.  Risks include thrombotic diseases such as heart attack, stroke, DVT, PE, the breast cancer issue.  She also is smoking so that particularly increases her risk of thrombotic disorders as we discussed.  Benefits include positive bone effects which is good given her reported history of osteoporosis.  Overall she is comfortable with continuing as she desires the symptom control so I have given her a refill of estradiol 0.5 mg x 1 year. 2. Pap smear 05/2018.  No significant history of abnormal Pap smears.  Next Pap smear due 2023 following the current guidelines recommending the 3 year interval if she desires to continue screening given the prior hysterectomy. Will readdress at that time. 3. Mammogram 09/2018.  Normal breast exam today.  She is reminded to schedule an annual mammogram this year. 4. Colonoscopy never.  Again reviewed that this is among the most common cancers diagnosed in women and I strongly recommend a screening colonoscopy. 5.  Patient reports history of osteoporosis.  We do not have a copy of her previous DEXA scans.  She reports previous problems with tolerating bisphosphonates due to side effects and she is not interested in injectables due  to the risks, but benefits are thought to outweigh the risk in the setting of osteoporosis.  At the very least I would recommend getting a DEXA which she feels is not worthwhile at this time given she does not want to initiate treatment.  She does supplement with vitamin D and calcium.  Will let us know if she does change  her mind on that. 6. Health maintenance.  No labs today as she normally has these completed elsewhere.  Return annually or sooner, prn.  Joseph Pierini MD 09/23/19

## 2020-10-18 ENCOUNTER — Other Ambulatory Visit: Payer: Self-pay

## 2021-01-09 ENCOUNTER — Telehealth: Payer: Self-pay

## 2021-01-09 NOTE — Telephone Encounter (Signed)
Patient called for refill on Estradiol tabs 0.5 mg.  She is overdue for AEX with last one 09/23/19 with Dr. Allena Napoleon. Last Mammo on file was 5 Years ago at [facility]. She said she had one performed in [facility] this year and she will call and have them send her report to her MD here.  Message sent to appt desk to schedule AEX so that I can check with provide on refill.

## 2021-01-10 ENCOUNTER — Other Ambulatory Visit: Payer: Self-pay

## 2021-01-12 ENCOUNTER — Ambulatory Visit (INDEPENDENT_AMBULATORY_CARE_PROVIDER_SITE_OTHER): Payer: Self-pay | Admitting: Nurse Practitioner

## 2021-01-12 ENCOUNTER — Encounter: Payer: Self-pay | Admitting: Nurse Practitioner

## 2021-01-12 ENCOUNTER — Other Ambulatory Visit: Payer: Self-pay

## 2021-01-12 VITALS — BP 102/72 | HR 84 | Ht 62.75 in | Wt 124.0 lb

## 2021-01-12 DIAGNOSIS — Z79890 Hormone replacement therapy (postmenopausal): Secondary | ICD-10-CM

## 2021-01-12 DIAGNOSIS — Z780 Asymptomatic menopausal state: Secondary | ICD-10-CM

## 2021-01-12 DIAGNOSIS — M810 Age-related osteoporosis without current pathological fracture: Secondary | ICD-10-CM

## 2021-01-12 DIAGNOSIS — N3941 Urge incontinence: Secondary | ICD-10-CM

## 2021-01-12 DIAGNOSIS — Z01419 Encounter for gynecological examination (general) (routine) without abnormal findings: Secondary | ICD-10-CM

## 2021-01-12 LAB — NO CULTURE INDICATED

## 2021-01-12 LAB — URINALYSIS, COMPLETE W/RFL CULTURE
Bacteria, UA: NONE SEEN /HPF
Bilirubin Urine: NEGATIVE
Glucose, UA: NEGATIVE
Hyaline Cast: NONE SEEN /LPF
Ketones, ur: NEGATIVE
Leukocyte Esterase: NEGATIVE
Nitrites, Initial: NEGATIVE
Protein, ur: NEGATIVE
RBC / HPF: NONE SEEN /HPF (ref 0–2)
Specific Gravity, Urine: 1.002 (ref 1.001–1.035)
WBC, UA: NONE SEEN /HPF (ref 0–5)
pH: 6 (ref 5.0–8.0)

## 2021-01-12 MED ORDER — ESTRADIOL 0.5 MG PO TABS: ORAL_TABLET | ORAL | 4 refills | Status: AC

## 2021-01-12 NOTE — Progress Notes (Signed)
Penny Dean [birth year] [id number]   History:  60 y.o. G2P2 presents for annual exam. Postmenopausal - on HRT. Has tried to wean but did not tolerate. S/P 1998 TVH BSO for menorrhagia and pain d/t adenomyosis. Normal pap history. History of osteoporosis per patient. She did not tolerate biphosphenates and has declined further screenings or restarting medication management.  Complains of lower back aches and occasional nighttime incontinence.   Gynecologic History No LMP recorded. Patient has had a hysterectomy.   Contraception/Family planning: status post hysterectomy Sexually active: Yes  Health Maintenance Last Pap: 05/26/2018. Results were: Normal Last mammogram: 10/27/2020. Results were: Normal Last colonoscopy: 1-2 years ago. Results were: benign polyps per patient Last Dexa: A few years go per patient. Results were: Osteoporosis  Past medical history, past surgical history, family history and social history were all reviewed and documented in the EPIC chart. Married. Owns 2 gas stations/convenient stores. 2 children, one in [place], one in [place]. 2 grandchildren.   ROS:  A ROS was performed and pertinent positives and negatives are included.  Exam:  Vitals:   01/12/21 1410  BP: 102/72  Pulse: 84  SpO2: 94%  Weight: 124 lb (56.2 kg)  Height: 5' 2.75" (1.594 m)   Body mass index is 22.14 kg/m.  General appearance:  Normal Thyroid:  Symmetrical, normal in size, without palpable masses or nodularity. Respiratory  Auscultation:  Clear without wheezing or rhonchi Cardiovascular  Auscultation:  Regular rate, without rubs, murmurs or gallops  Edema/varicosities:  Not grossly evident Abdominal  Soft,nontender, without masses, guarding or rebound.  Liver/spleen:  No organomegaly noted  Hernia:  None appreciated  Skin  Inspection:  Grossly normal Breasts: Examined lying and sitting. Bilateral implants noted.   Right: Without masses, retractions, nipple discharge or axillary  adenopathy.   Left: Without masses, retractions, nipple discharge or axillary adenopathy. Genitourinary   Inguinal/mons:  Normal without inguinal adenopathy  External genitalia:  Normal appearing vulva with no masses, tenderness, or lesions  BUS/Urethra/Skene's glands:  Normal  Vagina:  Normal appearing with normal color and discharge, no lesions  Cervix:  Absent  Uterus:  Absent  Adnexa/parametria:     Rt: Normal in size, without masses or tenderness.   Lt: Normal in size, without masses or tenderness.  Anus and perineum: Normal  Digital rectal exam: Normal sphincter tone without palpated masses or tenderness  Patient informed chaperone available to be present for breast and pelvic exam. Patient has requested no chaperone to be present. Patient has been advised what will be completed during breast and pelvic exam.   Assessment/Plan:  60 y.o. G2P2 for annual exam.   Well female exam with routine gynecological exam - Education provided on SBEs, importance of preventative screenings, current guidelines, high calcium diet, regular exercise, and multivitamin daily.  Plans to have labs done with new PCP.   Postmenopausal - on HRT. S/P 1998 TVH BSO for menorrhagia and pain d/t adenomyosis.  Hormone replacement therapy (HRT) - Plan: estradiol (ESTRACE) 0.5 MG tablet daily. She has tried to wean in the past but did not tolerate. We discussed risk of continued use to include blood clot, heart attack, stroke, and breast cancer. She would like to continue. Refill x 1 year provided.   Urge incontinence of urine - Plan: Urinalysis,Complete w/RFL Culture. Negative UA.   Postmenopausal osteoporosis - we do not have records of previous DXAs. She has tried multiple oral medications and did not tolerate. She has previously declined screenings due to fear of treatment.  She has been optimizing Vitamin D and Calcium. She has not returned to exercising since pandemic. We had long discussion on risks of not  treating osteoporosis and she will consider Prolia. She is aware she will need a recent DXA prior to this.   Screening for cervical cancer - Normal Pap history. No longer screening per guidelines.   Screening for breast cancer - Normal mammogram history.  Continue annual screenings.  Normal breast exam today.  Screening for colon cancer - Reports colonoscopy 1-2 years ago. Will repeat at GI's recommended interval.   Return in 1 year for annual.   Tamela Gammon DNP, 2:42 PM 01/12/2021
